# Patient Record
Sex: Male | Born: 1955 | Race: White | Hispanic: No | Marital: Married
Health system: Midwestern US, Academic
[De-identification: ages and names within clinical notes are randomized; demographics above are authoritative.]

---

## 2017-11-26 ENCOUNTER — Encounter: Admit: 2017-11-26 | Discharge: 2017-11-26

## 2017-12-08 ENCOUNTER — Encounter: Admit: 2017-12-08 | Discharge: 2017-12-08

## 2017-12-08 ENCOUNTER — Ambulatory Visit: Admit: 2017-12-08 | Discharge: 2017-12-08 | Payer: BC Managed Care – PPO

## 2017-12-08 DIAGNOSIS — Z9889 Other specified postprocedural states: ICD-10-CM

## 2017-12-08 DIAGNOSIS — R1031 Right lower quadrant pain: Principal | ICD-10-CM

## 2017-12-10 ENCOUNTER — Encounter: Admit: 2017-12-10 | Discharge: 2017-12-10

## 2017-12-10 DIAGNOSIS — Z298 Encounter for other specified prophylactic measures: Principal | ICD-10-CM

## 2017-12-17 ENCOUNTER — Encounter: Admit: 2017-12-17 | Discharge: 2017-12-18

## 2017-12-17 ENCOUNTER — Encounter: Admit: 2017-12-17 | Discharge: 2017-12-17

## 2017-12-17 DIAGNOSIS — R69 Illness, unspecified: Principal | ICD-10-CM

## 2018-01-05 ENCOUNTER — Encounter: Admit: 2018-01-05 | Discharge: 2018-01-05

## 2018-01-05 ENCOUNTER — Ambulatory Visit: Admit: 2018-01-05 | Discharge: 2018-01-05 | Payer: BC Managed Care – PPO

## 2018-01-05 DIAGNOSIS — Z9889 Other specified postprocedural states: ICD-10-CM

## 2018-01-05 DIAGNOSIS — R1031 Right lower quadrant pain: Principal | ICD-10-CM

## 2018-01-07 ENCOUNTER — Encounter: Admit: 2018-01-07 | Discharge: 2018-01-07

## 2018-01-07 DIAGNOSIS — R69 Illness, unspecified: Principal | ICD-10-CM

## 2018-01-14 ENCOUNTER — Encounter: Admit: 2018-01-14 | Discharge: 2018-01-14

## 2018-01-14 ENCOUNTER — Ambulatory Visit: Admit: 2018-01-14 | Discharge: 2018-01-15 | Payer: BC Managed Care – PPO

## 2018-01-14 DIAGNOSIS — H269 Unspecified cataract: Principal | ICD-10-CM

## 2018-01-14 DIAGNOSIS — R51 Headache: ICD-10-CM

## 2018-01-14 DIAGNOSIS — M5442 Lumbago with sciatica, left side: Principal | ICD-10-CM

## 2018-02-04 ENCOUNTER — Encounter: Admit: 2018-02-04 | Discharge: 2018-02-04

## 2018-02-04 DIAGNOSIS — M48061 Spinal stenosis, lumbar region without neurogenic claudication: Secondary | ICD-10-CM

## 2018-02-04 DIAGNOSIS — M5116 Intervertebral disc disorders with radiculopathy, lumbar region: Principal | ICD-10-CM

## 2018-02-04 MED ORDER — CEFAZOLIN INJ 1GM IVP
2 g | Freq: Once | INTRAVENOUS | 0 refills | Status: CN
Start: 2018-02-04 — End: ?

## 2018-02-18 ENCOUNTER — Encounter: Admit: 2018-02-18 | Discharge: 2018-02-18

## 2018-02-18 ENCOUNTER — Ambulatory Visit: Admit: 2018-02-18 | Discharge: 2018-02-19 | Payer: BC Managed Care – PPO

## 2018-02-18 DIAGNOSIS — R51 Headache: ICD-10-CM

## 2018-02-18 DIAGNOSIS — M1990 Unspecified osteoarthritis, unspecified site: ICD-10-CM

## 2018-02-18 DIAGNOSIS — K929 Disease of digestive system, unspecified: ICD-10-CM

## 2018-02-18 DIAGNOSIS — H269 Unspecified cataract: Principal | ICD-10-CM

## 2018-02-18 DIAGNOSIS — E079 Disorder of thyroid, unspecified: ICD-10-CM

## 2018-02-18 LAB — BASIC METABOLIC PANEL
Lab: 0.9 mg/dL (ref 0.4–1.24)
Lab: 105 MMOL/L — ABNORMAL LOW (ref 98–110)
Lab: 11 mg/dL (ref 7–25)
Lab: 112 mg/dL — ABNORMAL HIGH (ref 70–100)
Lab: 139 MMOL/L — ABNORMAL LOW (ref 137–147)
Lab: 29 MMOL/L (ref 21–30)
Lab: 3.9 MMOL/L — ABNORMAL LOW (ref 3.5–5.1)
Lab: 5 pg (ref 3–12)
Lab: 8.9 mg/dL (ref 8.5–10.6)
Lab: >60 mL/min (ref >60)
Lab: >60 mL/min (ref >60)

## 2018-02-18 LAB — CBC: Lab: 5.2 10*3/uL (ref 4.5–11.0)

## 2018-02-19 DIAGNOSIS — G8929 Other chronic pain: ICD-10-CM

## 2018-02-19 DIAGNOSIS — M5442 Lumbago with sciatica, left side: Principal | ICD-10-CM

## 2018-02-19 DIAGNOSIS — Z01818 Encounter for other preprocedural examination: ICD-10-CM

## 2018-03-08 ENCOUNTER — Encounter: Admit: 2018-03-08 | Discharge: 2018-03-08

## 2018-03-08 ENCOUNTER — Ambulatory Visit: Admit: 2018-03-08 | Discharge: 2018-03-08 | Payer: BC Managed Care – PPO

## 2018-03-08 DIAGNOSIS — M4726 Other spondylosis with radiculopathy, lumbar region: ICD-10-CM

## 2018-03-08 DIAGNOSIS — E079 Disorder of thyroid, unspecified: ICD-10-CM

## 2018-03-08 DIAGNOSIS — M5137 Other intervertebral disc degeneration, lumbosacral region: ICD-10-CM

## 2018-03-08 DIAGNOSIS — M5442 Lumbago with sciatica, left side: ICD-10-CM

## 2018-03-08 DIAGNOSIS — M48061 Spinal stenosis, lumbar region without neurogenic claudication: Principal | ICD-10-CM

## 2018-03-08 DIAGNOSIS — G8929 Other chronic pain: ICD-10-CM

## 2018-03-08 DIAGNOSIS — K929 Disease of digestive system, unspecified: ICD-10-CM

## 2018-03-08 DIAGNOSIS — M1990 Unspecified osteoarthritis, unspecified site: ICD-10-CM

## 2018-03-08 DIAGNOSIS — H269 Unspecified cataract: Principal | ICD-10-CM

## 2018-03-08 DIAGNOSIS — R51 Headache: ICD-10-CM

## 2018-03-08 MED ORDER — SODIUM CHLORIDE 0.9 % IV SOLP
1000 mL | INTRAVENOUS | 0 refills | Status: DC
Start: 2018-03-08 — End: 2018-03-09
  Administered 2018-03-08: 20:00:00 1000.000 mL via INTRAVENOUS

## 2018-03-08 MED ORDER — DEXAMETHASONE SODIUM PHOSPHATE 4 MG/ML IJ SOLN
4 mg | Freq: Once | INTRAVENOUS | 0 refills | Status: DC | PRN
Start: 2018-03-08 — End: 2018-03-09

## 2018-03-08 MED ORDER — FENTANYL CITRATE (PF) 50 MCG/ML IJ SOLN
12.5 ug | INTRAVENOUS | 0 refills | Status: DC | PRN
Start: 2018-03-08 — End: 2018-03-09

## 2018-03-08 MED ORDER — CEFAZOLIN INJ 1GM IVP
2 g | Freq: Once | INTRAVENOUS | 0 refills | Status: CP
Start: 2018-03-08 — End: ?
  Administered 2018-03-08: 19:00:00 2 g via INTRAVENOUS

## 2018-03-08 MED ORDER — PROPOFOL 10 MG/ML IV EMUL (INFUSION)(AM)(OR)
INTRAVENOUS | 0 refills | Status: DC
Start: 2018-03-08 — End: 2018-03-08
  Administered 2018-03-08: 18:00:00 120 ug/kg/min via INTRAVENOUS

## 2018-03-08 MED ORDER — MIDAZOLAM 1 MG/ML IJ SOLN
INTRAVENOUS | 0 refills | Status: DC
Start: 2018-03-08 — End: 2018-03-08
  Administered 2018-03-08: 18:00:00 2 mg via INTRAVENOUS

## 2018-03-08 MED ORDER — OXYCODONE 5 MG PO TAB
5 mg | Freq: Once | ORAL | 0 refills | Status: CP
Start: 2018-03-08 — End: ?
  Administered 2018-03-08: 21:00:00 5 mg via ORAL

## 2018-03-08 MED ORDER — HALOPERIDOL LACTATE 5 MG/ML IJ SOLN
1 mg | Freq: Once | INTRAVENOUS | 0 refills | Status: DC | PRN
Start: 2018-03-08 — End: 2018-03-09

## 2018-03-08 MED ORDER — BACITRACIN 50,000 UN NS 500 ML IRR BOT (OR)
0 refills | Status: DC
Start: 2018-03-08 — End: 2018-03-08
  Administered 2018-03-08 (×2): 500 mL

## 2018-03-08 MED ORDER — BUPIVACAINE 0.25 % (2.5 MG/ML) IJ SOLN
0 refills | Status: DC
Start: 2018-03-08 — End: 2018-03-08
  Administered 2018-03-08: 19:00:00 4 mL via INTRAMUSCULAR

## 2018-03-08 MED ORDER — REMIFENTANYL 1000MCG IN NS 20ML (OR)
INTRAVENOUS | 0 refills | Status: DC
Start: 2018-03-08 — End: 2018-03-08
  Administered 2018-03-08 (×2): .08 ug/kg/min via INTRAVENOUS

## 2018-03-08 MED ORDER — ACETAMINOPHEN 1,000 MG/100 ML (10 MG/ML) IV SOLN
0 refills | Status: DC
Start: 2018-03-08 — End: 2018-03-08
  Administered 2018-03-08: 20:00:00 1000 mg via INTRAVENOUS

## 2018-03-08 MED ORDER — ROCURONIUM 10 MG/ML IV SOLN
INTRAVENOUS | 0 refills | Status: DC
Start: 2018-03-08 — End: 2018-03-08
  Administered 2018-03-08: 18:00:00 60 mg via INTRAVENOUS

## 2018-03-08 MED ORDER — LIDOCAINE (PF) 10 MG/ML (1 %) IJ SOLN
.1-2 mL | INTRAMUSCULAR | 0 refills | Status: DC | PRN
Start: 2018-03-08 — End: 2018-03-09
  Administered 2018-03-08: 16:00:00 0.1 mL via INTRAMUSCULAR

## 2018-03-08 MED ORDER — THROMBIN (BOVINE) 5,000 UNIT TP SOLR
0 refills | Status: DC
Start: 2018-03-08 — End: 2018-03-08
  Administered 2018-03-08: 19:00:00 5000 [IU] via TOPICAL

## 2018-03-08 MED ORDER — FENTANYL CITRATE (PF) 50 MCG/ML IJ SOLN
25-50 ug | INTRAVENOUS | 0 refills | Status: DC | PRN
Start: 2018-03-08 — End: 2018-03-09

## 2018-03-08 MED ORDER — HYDROCODONE-ACETAMINOPHEN 5-325 MG PO TAB
1 | ORAL_TABLET | ORAL | 0 refills | 30.00000 days | Status: AC | PRN
Start: 2018-03-08 — End: 2018-07-11
  Filled 2018-03-08 (×2): qty 60, 7d supply, fill #1

## 2018-03-08 MED ORDER — LIDOCAINE (PF) 200 MG/10 ML (2 %) IJ SYRG
0 refills | Status: DC
Start: 2018-03-08 — End: 2018-03-08
  Administered 2018-03-08: 18:00:00 100 mg via INTRAVENOUS

## 2018-03-08 MED ORDER — PROPOFOL INJ 10 MG/ML IV VIAL
INTRAVENOUS | 0 refills | Status: DC
Start: 2018-03-08 — End: 2018-03-08
  Administered 2018-03-08: 20:00:00 50 mg via INTRAVENOUS
  Administered 2018-03-08: 18:00:00 150 mg via INTRAVENOUS

## 2018-03-08 MED ORDER — SCOPOLAMINE BASE 1 MG OVER 3 DAYS TD PT3D
0 refills | Status: DC
Start: 2018-03-08 — End: 2018-03-08
  Administered 2018-03-08: 18:00:00 1 via TOPICAL

## 2018-03-08 MED ORDER — DEXAMETHASONE SODIUM PHOSPHATE 4 MG/ML IJ SOLN
INTRAVENOUS | 0 refills | Status: DC
Start: 2018-03-08 — End: 2018-03-08
  Administered 2018-03-08: 19:00:00 4 mg via INTRAVENOUS

## 2018-03-08 MED ORDER — SUGAMMADEX 100 MG/ML IV SOLN
INTRAVENOUS | 0 refills | Status: DC
Start: 2018-03-08 — End: 2018-03-08
  Administered 2018-03-08: 20:00:00 190 mg via INTRAVENOUS

## 2018-03-08 MED ORDER — FENTANYL CITRATE (PF) 50 MCG/ML IJ SOLN
50 ug | INTRAVENOUS | 0 refills | Status: DC | PRN
Start: 2018-03-08 — End: 2018-03-09

## 2018-03-08 MED ORDER — LIDOCAINE-EPINEPHRINE 1 %-1:100,000 IJ SOLN
0 refills | Status: DC
Start: 2018-03-08 — End: 2018-03-08
  Administered 2018-03-08: 19:00:00 4 mL via INTRAMUSCULAR

## 2018-03-08 MED ORDER — ONDANSETRON HCL (PF) 4 MG/2 ML IJ SOLN
4 mg | Freq: Once | INTRAVENOUS | 0 refills | Status: DC | PRN
Start: 2018-03-08 — End: 2018-03-09

## 2018-03-08 MED ORDER — FENTANYL CITRATE (PF) 50 MCG/ML IJ SOLN
0 refills | Status: DC
Start: 2018-03-08 — End: 2018-03-08
  Administered 2018-03-08: 18:00:00 100 ug via INTRAVENOUS

## 2018-03-08 MED ORDER — METHYLPREDNISOLONE ACETATE 40 MG/ML IJ SUSP
0 refills | Status: DC
Start: 2018-03-08 — End: 2018-03-08
  Administered 2018-03-08: 19:00:00 40 mg via INTRAMUSCULAR

## 2018-03-08 MED ADMIN — SODIUM CHLORIDE 0.9 % IV SOLP [27838]: 1000 mL | INTRAVENOUS | @ 16:00:00 | Stop: 2018-03-08 | NDC 00338004904

## 2018-03-10 ENCOUNTER — Encounter: Admit: 2018-03-10 | Discharge: 2018-03-10

## 2018-03-10 DIAGNOSIS — E079 Disorder of thyroid, unspecified: ICD-10-CM

## 2018-03-10 DIAGNOSIS — R51 Headache: ICD-10-CM

## 2018-03-10 DIAGNOSIS — M1990 Unspecified osteoarthritis, unspecified site: ICD-10-CM

## 2018-03-10 DIAGNOSIS — H269 Unspecified cataract: Principal | ICD-10-CM

## 2018-03-10 DIAGNOSIS — K929 Disease of digestive system, unspecified: ICD-10-CM

## 2018-03-22 ENCOUNTER — Encounter: Admit: 2018-03-22 | Discharge: 2018-03-23

## 2018-03-24 ENCOUNTER — Ambulatory Visit: Admit: 2018-03-24 | Discharge: 2018-03-25 | Payer: BC Managed Care – PPO

## 2018-03-24 DIAGNOSIS — Z5189 Encounter for other specified aftercare: Principal | ICD-10-CM

## 2018-04-21 ENCOUNTER — Encounter: Admit: 2018-04-21 | Discharge: 2018-04-22

## 2018-05-06 ENCOUNTER — Encounter: Admit: 2018-05-06 | Discharge: 2018-05-06

## 2018-05-06 ENCOUNTER — Ambulatory Visit: Admit: 2018-05-06 | Discharge: 2018-05-07 | Payer: BC Managed Care – PPO

## 2018-05-06 DIAGNOSIS — E079 Disorder of thyroid, unspecified: ICD-10-CM

## 2018-05-06 DIAGNOSIS — M1990 Unspecified osteoarthritis, unspecified site: ICD-10-CM

## 2018-05-06 DIAGNOSIS — K929 Disease of digestive system, unspecified: ICD-10-CM

## 2018-05-06 DIAGNOSIS — H269 Unspecified cataract: Principal | ICD-10-CM

## 2018-05-06 DIAGNOSIS — R51 Headache: ICD-10-CM

## 2018-05-07 DIAGNOSIS — M542 Cervicalgia: Principal | ICD-10-CM

## 2018-06-17 ENCOUNTER — Ambulatory Visit: Admit: 2018-06-17 | Discharge: 2018-06-18 | Payer: BC Managed Care – PPO

## 2018-06-17 ENCOUNTER — Encounter: Admit: 2018-06-17 | Discharge: 2018-06-17

## 2018-06-17 DIAGNOSIS — K929 Disease of digestive system, unspecified: ICD-10-CM

## 2018-06-17 DIAGNOSIS — M1990 Unspecified osteoarthritis, unspecified site: ICD-10-CM

## 2018-06-17 DIAGNOSIS — E079 Disorder of thyroid, unspecified: ICD-10-CM

## 2018-06-17 DIAGNOSIS — R51 Headache: ICD-10-CM

## 2018-06-17 DIAGNOSIS — H269 Unspecified cataract: Principal | ICD-10-CM

## 2018-06-17 MED ORDER — DULOXETINE 30 MG PO CPDR
60 mg | ORAL_CAPSULE | Freq: Every day | ORAL | 0 refills | 60.00000 days | Status: AC
Start: 2018-06-17 — End: 2018-08-11

## 2018-06-17 MED ORDER — TIZANIDINE 2 MG PO TAB
4 mg | ORAL_TABLET | Freq: Every evening | ORAL | 2 refills | Status: AC | PRN
Start: 2018-06-17 — End: 2018-08-11

## 2018-06-18 DIAGNOSIS — M542 Cervicalgia: Principal | ICD-10-CM

## 2018-06-18 DIAGNOSIS — M7912 Myalgia of auxiliary muscles, head and neck: ICD-10-CM

## 2018-06-18 DIAGNOSIS — M5412 Radiculopathy, cervical region: ICD-10-CM

## 2018-07-12 ENCOUNTER — Ambulatory Visit: Admit: 2018-07-12 | Discharge: 2018-07-12 | Payer: BC Managed Care – PPO

## 2018-07-12 ENCOUNTER — Encounter: Admit: 2018-07-12 | Discharge: 2018-07-12

## 2018-07-12 ENCOUNTER — Ambulatory Visit: Admit: 2018-07-12 | Discharge: 2018-07-12

## 2018-07-12 DIAGNOSIS — H269 Unspecified cataract: Principal | ICD-10-CM

## 2018-07-12 DIAGNOSIS — E079 Disorder of thyroid, unspecified: ICD-10-CM

## 2018-07-12 DIAGNOSIS — M5412 Radiculopathy, cervical region: Principal | ICD-10-CM

## 2018-07-12 DIAGNOSIS — M1990 Unspecified osteoarthritis, unspecified site: ICD-10-CM

## 2018-07-12 DIAGNOSIS — R51 Headache: ICD-10-CM

## 2018-07-12 DIAGNOSIS — M7918 Myalgia, other site: ICD-10-CM

## 2018-07-12 DIAGNOSIS — K929 Disease of digestive system, unspecified: ICD-10-CM

## 2018-07-12 MED ORDER — LIDOCAINE (PF) 10 MG/ML (1 %) IJ SOLN
5 mL | Freq: Once | INTRAMUSCULAR | 0 refills | Status: CP
Start: 2018-07-12 — End: ?

## 2018-07-12 MED ORDER — OTHER MEDICATION
1 | Freq: Once | EPIDURAL | 0 refills | Status: CP
Start: 2018-07-12 — End: ?

## 2018-07-12 MED ORDER — TRIAMCINOLONE ACETONIDE 40 MG/ML IJ SUSP
80 mg | Freq: Once | EPIDURAL | 0 refills | Status: CP
Start: 2018-07-12 — End: ?

## 2018-07-12 MED ORDER — LIDOCAINE (PF) 10 MG/ML (1 %) IJ SOLN
5 mL | Freq: Once | INTRAMUSCULAR | 0 refills | Status: CP | PRN
Start: 2018-07-12 — End: ?

## 2018-08-11 ENCOUNTER — Encounter: Admit: 2018-08-11 | Discharge: 2018-08-11

## 2018-08-11 MED ORDER — TIZANIDINE 2 MG PO TAB
4 mg | ORAL_TABLET | Freq: Every evening | ORAL | 3 refills | Status: AC | PRN
Start: 2018-08-11 — End: 2018-10-14

## 2018-08-11 MED ORDER — DULOXETINE 60 MG PO CPDR
60 mg | ORAL_CAPSULE | Freq: Every day | ORAL | 3 refills | 60.00000 days | Status: AC
Start: 2018-08-11 — End: 2018-10-14

## 2018-10-14 ENCOUNTER — Encounter: Admit: 2018-10-14 | Discharge: 2018-10-14

## 2018-10-14 ENCOUNTER — Ambulatory Visit: Admit: 2018-10-14 | Discharge: 2018-10-14

## 2018-10-14 ENCOUNTER — Ambulatory Visit: Admit: 2018-10-14 | Discharge: 2018-10-14 | Payer: BC Managed Care – PPO

## 2018-10-14 DIAGNOSIS — M5481 Occipital neuralgia: ICD-10-CM

## 2018-10-14 DIAGNOSIS — E079 Disorder of thyroid, unspecified: ICD-10-CM

## 2018-10-14 DIAGNOSIS — H269 Unspecified cataract: Principal | ICD-10-CM

## 2018-10-14 DIAGNOSIS — M1990 Unspecified osteoarthritis, unspecified site: ICD-10-CM

## 2018-10-14 DIAGNOSIS — R51 Headache: ICD-10-CM

## 2018-10-14 DIAGNOSIS — K929 Disease of digestive system, unspecified: ICD-10-CM

## 2018-10-14 DIAGNOSIS — M5030 Other cervical disc degeneration, unspecified cervical region: ICD-10-CM

## 2018-10-14 DIAGNOSIS — M7918 Myalgia, other site: Secondary | ICD-10-CM

## 2018-10-14 DIAGNOSIS — M5442 Lumbago with sciatica, left side: Principal | ICD-10-CM

## 2018-10-14 DIAGNOSIS — M5412 Radiculopathy, cervical region: Principal | ICD-10-CM

## 2018-10-14 MED ORDER — DULOXETINE 60 MG PO CPDR
60 mg | ORAL_CAPSULE | Freq: Every day | ORAL | 3 refills | 60.00000 days | Status: AC
Start: 2018-10-14 — End: 2019-06-09

## 2019-02-14 ENCOUNTER — Encounter: Admit: 2019-02-14 | Discharge: 2019-02-14

## 2019-02-17 ENCOUNTER — Ambulatory Visit: Admit: 2019-02-17 | Discharge: 2019-02-18 | Payer: BC Managed Care – PPO

## 2019-02-17 ENCOUNTER — Encounter: Admit: 2019-02-17 | Discharge: 2019-02-17

## 2019-02-17 DIAGNOSIS — H269 Unspecified cataract: Principal | ICD-10-CM

## 2019-02-17 DIAGNOSIS — E079 Disorder of thyroid, unspecified: ICD-10-CM

## 2019-02-17 DIAGNOSIS — R51 Headache: ICD-10-CM

## 2019-02-17 DIAGNOSIS — M1990 Unspecified osteoarthritis, unspecified site: ICD-10-CM

## 2019-02-17 DIAGNOSIS — K929 Disease of digestive system, unspecified: ICD-10-CM

## 2019-02-18 DIAGNOSIS — G8929 Other chronic pain: ICD-10-CM

## 2019-02-18 DIAGNOSIS — M5442 Lumbago with sciatica, left side: Principal | ICD-10-CM

## 2019-02-20 ENCOUNTER — Encounter: Admit: 2019-02-20 | Discharge: 2019-02-20

## 2019-02-24 ENCOUNTER — Encounter: Admit: 2019-02-24 | Discharge: 2019-02-24

## 2019-02-24 ENCOUNTER — Ambulatory Visit: Admit: 2019-02-24 | Discharge: 2019-02-25 | Payer: BC Managed Care – PPO

## 2019-02-24 DIAGNOSIS — K929 Disease of digestive system, unspecified: ICD-10-CM

## 2019-02-24 DIAGNOSIS — H269 Unspecified cataract: Principal | ICD-10-CM

## 2019-02-24 DIAGNOSIS — R51 Headache: ICD-10-CM

## 2019-02-24 DIAGNOSIS — E079 Disorder of thyroid, unspecified: ICD-10-CM

## 2019-02-24 DIAGNOSIS — M1990 Unspecified osteoarthritis, unspecified site: ICD-10-CM

## 2019-02-24 DIAGNOSIS — M961 Postlaminectomy syndrome, not elsewhere classified: ICD-10-CM

## 2019-02-24 DIAGNOSIS — M5030 Other cervical disc degeneration, unspecified cervical region: ICD-10-CM

## 2019-02-24 DIAGNOSIS — M5481 Occipital neuralgia: ICD-10-CM

## 2019-02-24 DIAGNOSIS — M7918 Myalgia, other site: ICD-10-CM

## 2019-02-25 DIAGNOSIS — M7912 Myalgia of auxiliary muscles, head and neck: ICD-10-CM

## 2019-02-25 DIAGNOSIS — M5412 Radiculopathy, cervical region: ICD-10-CM

## 2019-02-25 DIAGNOSIS — M5417 Radiculopathy, lumbosacral region: Principal | ICD-10-CM

## 2019-03-02 ENCOUNTER — Encounter: Admit: 2019-03-02 | Discharge: 2019-03-02

## 2019-03-09 ENCOUNTER — Encounter: Admit: 2019-03-09 | Discharge: 2019-03-09

## 2019-03-16 ENCOUNTER — Encounter: Admit: 2019-03-16 | Discharge: 2019-03-16

## 2019-03-16 ENCOUNTER — Ambulatory Visit: Admit: 2019-03-16 | Discharge: 2019-03-16 | Payer: BC Managed Care – PPO

## 2019-03-16 ENCOUNTER — Ambulatory Visit: Admit: 2019-03-16 | Discharge: 2019-03-16

## 2019-03-16 ENCOUNTER — Ambulatory Visit: Admit: 2019-03-16 | Discharge: 2019-03-17

## 2019-03-16 DIAGNOSIS — R52 Pain, unspecified: ICD-10-CM

## 2019-03-16 DIAGNOSIS — M7918 Myalgia, other site: ICD-10-CM

## 2019-03-16 DIAGNOSIS — M5417 Radiculopathy, lumbosacral region: ICD-10-CM

## 2019-03-16 DIAGNOSIS — K929 Disease of digestive system, unspecified: ICD-10-CM

## 2019-03-16 DIAGNOSIS — M5137 Other intervertebral disc degeneration, lumbosacral region: ICD-10-CM

## 2019-03-16 DIAGNOSIS — G8929 Other chronic pain: ICD-10-CM

## 2019-03-16 DIAGNOSIS — H269 Unspecified cataract: Principal | ICD-10-CM

## 2019-03-16 DIAGNOSIS — R51 Headache: ICD-10-CM

## 2019-03-16 DIAGNOSIS — M5442 Lumbago with sciatica, left side: Principal | ICD-10-CM

## 2019-03-16 DIAGNOSIS — M1990 Unspecified osteoarthritis, unspecified site: ICD-10-CM

## 2019-03-16 DIAGNOSIS — E079 Disorder of thyroid, unspecified: ICD-10-CM

## 2019-03-16 MED ORDER — DEXAMETHASONE SODIUM PHOS (PF) 10 MG/ML IJ SOLN
15 mg | Freq: Once | 0 refills | Status: CP
Start: 2019-03-16 — End: ?

## 2019-03-16 MED ORDER — LIDOCAINE (PF) 10 MG/ML (1 %) IJ SOLN
5 mL | Freq: Once | INTRAMUSCULAR | 0 refills | Status: CP | PRN
Start: 2019-03-16 — End: ?

## 2019-03-16 MED ORDER — LIDOCAINE (PF) 10 MG/ML (1 %) IJ SOLN
2 mL | Freq: Once | INTRAMUSCULAR | 0 refills | Status: CP
Start: 2019-03-16 — End: ?

## 2019-03-16 MED ORDER — IOHEXOL 300 MG IODINE/ML IV SOLN
2 mL | Freq: Once | 0 refills | Status: CP
Start: 2019-03-16 — End: ?

## 2019-04-13 ENCOUNTER — Ambulatory Visit: Admit: 2019-04-13 | Discharge: 2019-04-14

## 2019-04-13 ENCOUNTER — Encounter: Admit: 2019-04-13 | Discharge: 2019-04-13

## 2019-04-13 DIAGNOSIS — M5417 Radiculopathy, lumbosacral region: Secondary | ICD-10-CM

## 2019-04-13 DIAGNOSIS — H269 Unspecified cataract: Secondary | ICD-10-CM

## 2019-04-13 DIAGNOSIS — R51 Headache: Secondary | ICD-10-CM

## 2019-04-13 DIAGNOSIS — M5030 Other cervical disc degeneration, unspecified cervical region: Secondary | ICD-10-CM

## 2019-04-13 DIAGNOSIS — M1990 Unspecified osteoarthritis, unspecified site: Secondary | ICD-10-CM

## 2019-04-13 DIAGNOSIS — E079 Disorder of thyroid, unspecified: Secondary | ICD-10-CM

## 2019-04-13 DIAGNOSIS — M7918 Myalgia, other site: Secondary | ICD-10-CM

## 2019-04-13 DIAGNOSIS — M5412 Radiculopathy, cervical region: Secondary | ICD-10-CM

## 2019-04-13 DIAGNOSIS — K929 Disease of digestive system, unspecified: Secondary | ICD-10-CM

## 2019-04-13 NOTE — Progress Notes
Obtained patient's verbal consent to treat them and their agreement to [facility] financial policy and NPP via this telehealth visit during the [facility]    [facility] - Interventional Pain    Subjective     Chief Complaint: multifocal pain    Interval HPI: Frank Jenkins is a 63 y.o. male who  has a past medical history of Arthritis, Cataract, Disorder of thyroid gland, Gastrointestinal disorder, and Generalized headaches. who now presents for evaluation.    Patient reports 90% relief after left L4-S1 TFESI and TPI performed on 03/16/2019. Patient states he is extremely happy with current level of relief.     The pain is in the across the low back, will radiate in anterolateral distribution to the knee in LLE  Denies radicular pain in RLE  Pain is intermittent  Numbness/tingling: yes - intermittent, noted in LLE  The pain ranges 2-5/10  The pain is described as ache, sore  The pain is exacerbated by walking > 1 mile,  The pain is partially alleviated by position changes/sitting, water   +muscle stiffness/tightness  Reports intermittent weakness in BLE    NECK  Pain is located in the middle of the low neck, will radiate to bilateral posterior shoulders, R>L  Pain is intermittent   Rated as 4-5/10  Described as ache, stiff  +HA - significantly improved since starting Cymbalta  Reports some decrease in grip strength    Currently taking Cymbalta 60mg  daily and reports significant relief of neck, HA, and low back pain. Denies any SEs from medication use.    Hx of spine surgery: L4-L5 Discectomy - Dr. Lorel Monaco    Patient denies fevers, chills, infection, bleeding issues, anticoagulants, new muscle weakness, numbness, tingling, bowel/bladder incontinence, or saddle anesthesia.           PRIOR MEDICATIONS:   Effective  Cymbalta  Tizanidine (little)    Ineffective  Tylenol  Flexeril      Unable to tolerate  NSAID    Never  Gabapentin  Lyrica  Ami/Nortriptyline PRIOR INTERVENTIONS:  L-spine surgery 03/2018 at   Effective  left L4-S1 TFESI   TPI  LESI (prior to surgery)    Ineffective  Exercise, limited by pain        Frank Jenkins denies any recent fevers, chills, infection, antibiotics, bowel or bladder incontinence, saddle anesthesia, bleeding issues, or recent anticoagulant.     ROS: All 14 systems reviewed and found to be negative except as above and as follows. +HA    Past Medical History:  Medical History:   Diagnosis Date   ??? Arthritis    ??? Cataract    ??? Disorder of thyroid gland     hypothyroidectomy   ??? Gastrointestinal disorder     GERD   ??? Generalized headaches        Family History:  Family History   Problem Relation Age of Onset   ??? Cataract Mother    ??? Hypertension Mother    ??? Hyperlipidemia Mother    ??? Thyroid Disease Mother    ??? Cancer Father         lung   ??? Cancer Sister         breast   ??? Cancer Brother         multiple myeloma   ??? Cancer Sister         breast   ??? Cancer Sister         breast   ???  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   Social History     Socioeconomic History   ??? Marital status: Married     Spouse name: Not on file   ??? Number of children: Not on file   ??? Years of education: Not on file   ??? Highest education level: Not on file   Occupational History   ??? Not on file   Tobacco Use   ??? Smoking status: Former Smoker     Years: 4.00     Types: Cigarettes     Last attempt to quit: 01/15/1979     Years since quitting: 40.2   ??? Smokeless tobacco: Never Used   Substance and Sexual Activity   ??? Alcohol use: Yes     Comment: beer socially   ??? Drug use: No   ??? Sexual activity: Not on file   Other Topics Concern   ??? Not on file   Social History Narrative   ??? Not on file       Allergies:  Allergies   Allergen Reactions   ??? Albuterol RASH and EDEMA   ??? Aspirin ANAPHYLAXIS and EDEMA   ??? Ibuprofen ANAPHYLAXIS   ??? Ketorolac ANAPHYLAXIS ??? Nsaids (Non-Steroidal Anti-Inflammatory Drug) ANAPHYLAXIS       Medications:    Current Outpatient Medications:   ???  acetaminophen (TYLENOL) 500 mg tablet, Take 500 mg by mouth every 6 hours as needed for Pain. Max of 4,000 mg of acetaminophen in 24 hours., Disp: , Rfl:   ???  calcium carbonate (TUMS) 500 mg (200 mg elemental calcium) chewable tablet, Chew 500-1,000 mg by mouth daily as needed., Disp: , Rfl:   ???  duloxetine DR (CYMBALTA) 60 mg capsule, Take one capsule by mouth daily. Start after 30 mg gone., Disp: 30 capsule, Rfl: 3  ???  hypromellose/dextran (ARTIFICIAL TEARS(DEXT70-HYPRO)) drop, Instill 1 drop into both eyes daily as needed, Disp: , Rfl:   ???  levothyroxine (SYNTHROID) 50 mcg tablet, Take 50 mcg by mouth daily 30 minutes before breakfast., Disp: , Rfl:   ???  methyl salicylate/menthol (BENGAY TP), Apply  topically to affected area daily as needed., Disp: , Rfl:   ???  pantoprazole DR (PROTONIX) 40 mg tablet, Take 40 mg by mouth every morning., Disp: , Rfl:     Physical examination:   Ht 177.8 cm (70)  - Wt 95.3 kg (210 lb)  - BMI 30.13 kg/m???   Pain Score: One    Patient has h/o HTN: No  Patient reports being afebrile Yes    Exam performed by instructing patient to perform various maneuvers and provide feedback while I personally visualized the patient exam.     General: The patient is a well-developed, well nourished 63 y.o. male in no acute distress.   HEENT: Head is normocephalic and atraumatic.   Pulmonary: The patient has unlabored respirations and bilateral symmetric chest excursion.   Abdomen: Soft, nontender, and nondistended. There is no rebound or guarding reported with patient palpation.   Extremities: No peripheral edema reported by patient or seen on video.     Neurologic:   The patient is alert and oriented times 3.   Cranial nerves II through XII are intact without any focal deficits.     Musculoskeletal:   Gait is normal.    C-Spine There is moderate paraspinal tenderness  per patient assessment.  Moderate TTP to bilateral trapezius per patient assessment.   ROM with flexion, extension, rotation, and lateral bending is intact but stiff.  Patient believes strength is equal and adequate bilaterally in the flexors and extensors of the bilateral upper extremities.     L-Spine   Facet loading is positive.  ROM with flexion, extension, rotation, and lateral bending is intact.  Patient believes strength is equal and adequate bilaterally in the flexors and extensors of the bilateral lower extremities.         MRI C-Spine  04/2018  [facility]    C2-3 Min DB.   C3-4 Mild broad DB. L NFS. +FA.   C4-5 Broad DB. +FA. Bil NFS mod-sev R and mod L.   C5-6 Mild L FA. Mil L NFS        Last Cr and LFT's:  Creatinine   Date Value Ref Range Status   02/18/2018 0.91 0.4 - 1.24 MG/DL Final          Assessment:    Frank Jenkins is a 63 y.o. male who  has a past medical history of Arthritis, Cataract, Disorder of thyroid gland, Gastrointestinal disorder, and Generalized headaches. who presents for evaluation of pain.    The pain complaints are most likely due to:    1. Cervical radiculopathy     2. DDD (degenerative disc disease), cervical     3. Myalgia, other site  [place] AMB SPINE PROC TRIGGER POINT INJECTION   4. Lumbosacral radiculopathy         Patient has had an adequate trial of > 3 months of rest, exercise, multimodal treatment, and the passage of time without improvement of symptoms. The pain has significant impact on the daily quality of life.     Plan:    1. Discussed plan of care options with patient. Will schedule CESI at next available appointment.   2. Will plan for TPI at same procedure.  3. Continue Cymbalta as prescribed. Discussed risks and benefits of medication therapy and possible SEs, patient verbalized understanding and all questions answered.   4. May still consider bilateral L4-S1 MBBs in future for lumbar facetogenic pain. 5. May consider trial of Gabapentin in future, patient wishes to defer at this time and doesn't wish to pursue any additional medications.  6. Follow up as needed.     Risks/benefits of all pharmacologic and interventional treatments discussed and questions answered.      Todays visit took place via face-to-face encounter utilizing Zoom application. Visit Start Time 1441 Visit End Time 1515.  Today's visit was prolonged due to additional time spent pharmacological therapies and interventional scheduling options.

## 2019-06-08 ENCOUNTER — Encounter: Admit: 2019-06-08 | Discharge: 2019-06-08

## 2019-06-09 MED ORDER — DULOXETINE 60 MG PO CPDR
60 mg | ORAL_CAPSULE | Freq: Every day | ORAL | 5 refills | 60.00000 days | Status: DC
Start: 2019-06-09 — End: 2019-06-15

## 2019-06-15 ENCOUNTER — Encounter: Admit: 2019-06-15 | Discharge: 2019-06-15

## 2019-06-15 ENCOUNTER — Ambulatory Visit: Admit: 2019-06-15 | Discharge: 2019-06-15

## 2019-06-15 DIAGNOSIS — M1990 Unspecified osteoarthritis, unspecified site: Secondary | ICD-10-CM

## 2019-06-15 DIAGNOSIS — R52 Pain, unspecified: Principal | ICD-10-CM

## 2019-06-15 DIAGNOSIS — R51 Headache: Secondary | ICD-10-CM

## 2019-06-15 DIAGNOSIS — M5412 Radiculopathy, cervical region: Principal | ICD-10-CM

## 2019-06-15 DIAGNOSIS — E079 Disorder of thyroid, unspecified: Secondary | ICD-10-CM

## 2019-06-15 DIAGNOSIS — K929 Disease of digestive system, unspecified: Secondary | ICD-10-CM

## 2019-06-15 DIAGNOSIS — M7918 Myalgia, other site: Secondary | ICD-10-CM

## 2019-06-15 DIAGNOSIS — H269 Unspecified cataract: Secondary | ICD-10-CM

## 2019-06-15 DIAGNOSIS — M5030 Other cervical disc degeneration, unspecified cervical region: Secondary | ICD-10-CM

## 2019-06-15 MED ORDER — LIDOCAINE (PF) 10 MG/ML (1 %) IJ SOLN
2 mL | Freq: Once | INTRAMUSCULAR | 0 refills | Status: CP
Start: 2019-06-15 — End: ?

## 2019-06-15 MED ORDER — TRIAMCINOLONE ACETONIDE 40 MG/ML IJ SUSP
80 mg | Freq: Once | EPIDURAL | 0 refills | Status: CP
Start: 2019-06-15 — End: ?

## 2019-06-15 MED ORDER — IOHEXOL 300 MG IODINE/ML IV SOLN
2 mL | Freq: Once | 0 refills | Status: CP
Start: 2019-06-15 — End: ?

## 2019-06-15 MED ORDER — DULOXETINE 60 MG PO CPDR
60 mg | ORAL_CAPSULE | Freq: Every day | ORAL | 11 refills | 60.00000 days | Status: AC
Start: 2019-06-15 — End: ?

## 2019-06-15 MED ORDER — LIDOCAINE (PF) 10 MG/ML (1 %) IJ SOLN
5 mL | Freq: Once | INTRAMUSCULAR | 0 refills | Status: CP | PRN
Start: 2019-06-15 — End: ?

## 2019-06-15 NOTE — Progress Notes
SPINE CENTER  INTERVENTIONAL PAIN PROCEDURE HISTORY AND PHYSICAL    No chief complaint on file.  Neck pain    HISTORY OF PRESENT ILLNESS:  Neck pain with radicular component. +myalgia    Medical History:   Diagnosis Date   ??? Arthritis    ??? Cataract    ??? Disorder of thyroid gland     hypothyroidectomy   ??? Gastrointestinal disorder     GERD   ??? Generalized headaches        Surgical History:   Procedure Laterality Date   ??? HX EYE SURGERY Right 1995    vitrectomy   ??? HX EYE SURGERY Right 1995    retinal reattachment   ??? HX EYE SURGERY Right 1997    Gas bubble   ??? HX EYE SURGERY Left 1998    retinal reattachment   ??? HERNIA REPAIR Bilateral 2009    inguinal   ??? HX CATARACT REMOVAL Right 2013   ??? LEFT LUMBAR 4-5 DISCECTOMY Left 03/08/2018    Performed by Emogene Morgan, MD at [facility] OR       family history includes Cancer in his brother, brother, father, sister, sister, and sister; Cataract in his mother; Hyperlipidemia in his mother; Hypertension in his mother; Thyroid Disease in his mother.    Social History     Socioeconomic History   ??? Marital status: Married     Spouse name: Not on file   ??? Number of children: Not on file   ??? Years of education: Not on file   ??? Highest education level: Not on file   Occupational History   ??? Not on file   Tobacco Use   ??? Smoking status: Former Smoker     Years: 4.00     Types: Cigarettes     Last attempt to quit: 01/15/1979     Years since quitting: 40.4   ??? Smokeless tobacco: Never Used   Substance and Sexual Activity   ??? Alcohol use: Yes     Comment: beer socially   ??? Drug use: No   ??? Sexual activity: Not on file   Other Topics Concern   ??? Not on file   Social History Narrative   ??? Not on file       Allergies   Allergen Reactions   ??? Albuterol RASH and EDEMA   ??? Aspirin ANAPHYLAXIS and EDEMA   ??? Ibuprofen ANAPHYLAXIS   ??? Ketorolac ANAPHYLAXIS   ??? Nsaids (Non-Steroidal Anti-Inflammatory Drug) ANAPHYLAXIS       There were no vitals filed for this visit. REVIEW OF SYSTEMS: 10 point ROS obtained and negative       PHYSICAL EXAM:    General: The patient is a well-developed, well nourished 63 y.o. male in no acute distress.   HEENT: Head is normocephalic and atraumatic.   Pulmonary: The patient has unlabored respirations and bilateral symmetric chest excursion.   Abdomen: Soft, nontender, and nondistended.   Extremities: No peripheral edema reported by patient or seen on video.     Neurologic:   The patient is alert and oriented times 3.     Musculoskeletal:     C-Spine   There is moderate paraspinal tenderness per patient assessment.        IMPRESSION:    1. Cervical radiculopathy    2. Myalgia, other site    3. DDD (degenerative disc disease), cervical         PLAN: Cervical Epidural Steroid Injection C7-T1 and TPI

## 2019-06-15 NOTE — Discharge Instructions - Supplementary Instructions
GENERAL POST PROCEDURE INSTRUCTIONS    Physician: _________________________________    Procedure Completed Today:  o Joint Injection (hip, knee, shoulder)  o Cervical Epidural Steroid Injection  o Cervical Transforaminal Steroid Injection  o Trigger Point Injection  o Other___________________________ o Thoracic Epidural Steroid Injection  o Lumbar Epidural Steroid Injection  o Lumbar Transforaminal Steroid Injection  o Facet Joint Injection     Important information following your procedure today:  o You may drive today     o If you had sedation, you may NOT drive today  ? Rest at home for the next 6 hours.  You may then begin to resume your normal activities.  ? DO NOT drive any vehicle, operate any power tools, drink alcohol, make any major decisions, or sign any legal documents for the next 12 hours.  1. Pain relief may not be immediate. It is possible you may even experience an increase in pain during the first 24-48 hours followed by a gradual decrease of your pain.  2. Though the procedure is generally safe and complications are rare, we do ask that you be aware of any of the following:  ? Any swelling, persistent redness, new bleeding or drainage from the site of the injection.  ? You should not experience a severe headache.  ? You should not run a fever over 101oF.  ? New onset of sharp, severe back and or neck pain.  ? New onset of upper or lower extremity numbness or weakness.  ? New difficulty controlling bowel or bladder function after injection.  ? New shortness of breath.  If any of these occur, please call to report this occurrence to a nurse at [phone]. If you are calling after 4:00pm, on the weekends or holidays please call [phone] and ask to have the pain management resident physician on call for the physician paged or go to your local emergency room.   3. You may experience soreness at the injection site. Ice can be applied at 20 minute intervals for the first 24 hours. The following day you may alternate ice with heat if you are experiencing muscle tightness, otherwise continue with ice. Ice works best at decreasing pain. Avoid application of direct heat, hot showers or hot tubs today.  4. Avoid strenuous activity today. You many resume your regular activities and exercise tomorrow.  5. Patients with diabetes may see an elevation in blood sugars for 7-10 days after the injection. It is important to pay close attention to your diet, check your blood sugars daily and repost extreme elevations to the physician that treats your diabetes.  6. Patients taking daily blood thinners can resume their regular dose this evening.  7. It is important that you take all medications ordered by your pain physician. Taking medications as ordered is an important part of you pain care plan. If you cannot continue the medication plan, please notify the physician.    Possible side effects to steroids that may occur:  ? Flushing or redness of the face  ? Irritability  ? Fluid retention  ? Change in women's menses      Follow up appointment as needed if in the event you are unable to keep an appointment please notify the scheduler 24 hours in advance at [phone]. If you have questions for the surgery center, call [facility] at [phone].

## 2019-06-15 NOTE — Procedures
Attending Surgeon: Evelina Bucy, MD    Anesthesia: Local    Pre-Procedure Diagnosis:   1. Cervical radiculopathy    2. Myalgia, other site    3. DDD (degenerative disc disease), cervical        Post-Procedure Diagnosis:   1. Cervical radiculopathy    2. Myalgia, other site    3. DDD (degenerative disc disease), cervical        Epidural Steroid Injection Cervical/Thoracic  Procedure: epidural - interlaminar    Laterality: n/a    Location: cervical ESI with imaging - C7-T1      Consent:   Consent obtained: written  Consent given by: patient  Risks discussed: allergic reaction, bleeding, bruising, infection, never damage, no change or worsening in pain, pneumo thorax, reaction to medication, seizure, swelling and weakness  Alternatives discussed: alternative treatment, delayed treatment and no treatment  Discussed with patient the purpose of the treatment/procedure, other ways of treating my condition, including no treatment/ procedure and the risks and benefits of the alternatives. Patient has decided to proceed with treatment/procedure.        Universal Protocol:  Relevant documents: relevant documents present and verified  Site marked: the operative site was marked  Patient identity confirmed: Patient identify confirmed verbally with patient.        Time out: Immediately prior to procedure a time out was called to verify the correct patient, procedure, equipment, support staff and site/side marked as required      Procedures Details:   Indications: pain   Prep: chlorhexidine  Patient position: prone  Estimated Blood Loss: minimal  Specimens: none  Amount Injected:   C7-T1: 4mL    Number of Levels: 1  Approach: midline  Guidance: fluoroscopy  Contrast: Procedure confirmed with contrast under live fluoroscopy.  Needle and Epidural Catheter: tuohy  Needle size: 18 G  Injection procedure: Incremental injection and Negative aspiration for blood  Patient tolerance: Patient tolerated the procedure well with no immediate complications. Pressure was applied, and hemostasis was accomplished.  Outcome: Pain unchanged  Comments:   CERVICAL INTERLAMINAR EPIDURAL STEROID INJECTION    PROCEDURE:  1) C7-T1 interlaminar epidural steroid injection  2) Fluoroscopic needle guidance    REASON FOR PROCEDURE: Cervical radiculopathy, DDD (cervical)    ATTENDING PHYSICIAN: Evelina Bucy, MD    MEDICATIONS INJECTED: 2 mL of triamciniolone (80 mg) and 2 mL of sterile, preservative-free normal saline    LOCAL ANESTHETIC INJECTED: 1 mL of 1% lidocaine    SEDATION MEDICATIONS: None    ESTIMATED BLOOD LOSS: None    SPECIMENS REMOVED: None    COMPLICATIONS: None    TECHNIQUE: Time-out was taken to identify the correct patient, procedure and side prior to starting the procedure. With the patient lying in a prone position with the neck in a slightly  flexed position, the area was prepped and draped in sterile fashion using DuraPrep and a fenestrated drape. The area was determined under fluoroscopic guidance. A 27-gauge, 1.25-inch  needle was used to anesthetize the needle entry site and subcutaneous tissues.     The 18-gauge, 3.5-inch Tuohy needle was advanced through the ligamentum flavum using loss of resistance technique. Once the tip of the needle was thought to be in the desired position, contrast was injected to confirm only epidural spread and no vascular runoff via A-P and lateral  views. The injectate was then injected slowly with intermittent negative aspiration.    The procedure was completed without complications and was tolerated well. The patient was monitored  after the procedure. The patient (or responsible party) was given post-procedure and  discharge instructions to follow at home. The patient was discharged in stable condition.    Evelina Bucy, MD, MBA      Trigger Point Injection  Locations: L cervical, R cervical, L levator scapulae, R levator scapulae, L upper trapezius and R upper trapezius  Consent:   Consent obtained: written Consent given by: patient  Alternatives discussed: alternative treatment, delayed treatment and no treatment  Discussed with patient the purpose of the treatment/procedure, other ways of treating my condition, including no treatment/ procedure and the risks and benefits of the alternatives. Patient has decided to proceed with treatment/procedure.        Universal Protocol:  Relevant documents: relevant documents present and verified  Site marked: the operative site was marked  Patient identity confirmed: Patient identify confirmed verbally with patient.        Time out: Immediately prior to procedure a time out was called to verify the correct patient, procedure, equipment, support staff and site/side marked as required      Procedures Details:   Indications: muscle spasm, myalgia and painPrep: alcohol  Needle size: 27 G  Number of muscles: 3 or more  Approach: posterior  Medications administered: 5 mL lidocaine PF 1% (10 mg/mL)  Patient tolerance: Patient tolerated the procedure well with no immediate complications. Pressure was applied, and hemostasis was accomplished.  Comments:   PROCEDURE: Trigger Point Injections    Pre-Procedure Diagnoses:  1. Myalgia, other  2. Myofascial pain  3. Muscle spasm    Post-Procedure Diagnoses:  Same    Physician: Evelina Bucy, MD    ANESTHESIA: Lidocaine 1%    COMPLICATIONS: None.    Location(s) of pain: As noted above  Presence of point tenderness in a tight band of muscle in above area: Yes  Restricted range of motion: Yes  Conservative therapy attempted for at least 6 weeks: activity modification, pharmacotherapy  Other components of comprehensive management: pharmacotherapy, activity modification    DESCRIPTION OF PROCEDURE: The procedure risks, hazards and alternatives were discussed with the patient and a proper consent was obtained. The area over each trigger point was prepped with alcohol utilizing sterile technique. After isolating it between two palpating fingertips a 27-gauge 1.5 needle was placed in the center of the myofascial spasms and a negative aspiration was performed. Then 0.5cc of the solution above was injected into each trigger point.     Trigger points in each of the muscle groups noted above were injected. A total of 5 ml of the local anesthetic solution was utilized.    The patient tolerated the procedure well without any apparent difficulties or complications.          Estimated blood loss: none or minimal  Specimens: none  Patient tolerated the procedure well with no immediate complications. Pressure was applied, and hemostasis was accomplished.

## 2019-07-18 ENCOUNTER — Encounter: Admit: 2019-07-18 | Discharge: 2019-07-18

## 2019-07-18 ENCOUNTER — Ambulatory Visit: Admit: 2019-07-18 | Discharge: 2019-07-19 | Payer: BC Managed Care – PPO

## 2019-09-15 ENCOUNTER — Encounter: Admit: 2019-09-15 | Discharge: 2019-09-15

## 2019-09-15 NOTE — Telephone Encounter
Patients spouse called requesting that her husband get an appointment to see Dr. Karsten Ro for increased low back and left leg pain.

## 2019-10-12 ENCOUNTER — Encounter: Admit: 2019-10-12 | Discharge: 2019-10-12

## 2019-10-12 ENCOUNTER — Ambulatory Visit: Admit: 2019-10-12 | Discharge: 2019-10-13 | Payer: BC Managed Care – PPO

## 2019-10-12 DIAGNOSIS — M1990 Unspecified osteoarthritis, unspecified site: Secondary | ICD-10-CM

## 2019-10-12 DIAGNOSIS — R519 Generalized headaches: Secondary | ICD-10-CM

## 2019-10-12 DIAGNOSIS — E079 Disorder of thyroid, unspecified: Secondary | ICD-10-CM

## 2019-10-12 DIAGNOSIS — K929 Disease of digestive system, unspecified: Secondary | ICD-10-CM

## 2019-10-12 DIAGNOSIS — M5442 Lumbago with sciatica, left side: Secondary | ICD-10-CM

## 2019-10-12 DIAGNOSIS — H269 Unspecified cataract: Secondary | ICD-10-CM

## 2019-10-12 NOTE — Progress Notes
HPI:  I had the pleasure of seeing Frank Jenkins in the office today.  Patient presents for a follow-up visit after having undergone a left-sided L4/5 decompression.  This provided good benefit initially, but he subsequently developed left leg pain recurrence which was very intense.  At the time he called for another appointment and ever since then his pain has gradually improved.  He is now much better than when he called for a subsequent appointment, but still has some discomfort at around a 4 out of 10.  He has been managed by Dr. Zollie Scale with injections in the past for both cervical and lumbar region so I encouraged him to call Dr. Precious Gilding office to undergo an injection in the lumbar spine.  I also encouraged him to consider physical therapy after he undergoes his injection so that he can hopefully be able to participate better and work on leg strength.  We will have him call us if he has any changes in his current status.      Exam:  AA  Conversant  Speech fluent  MAEW with good strength  CN II-XII GI  Sensation GI  Seems to favor the left lower extremity when he ambulates      Plan:  Plan is for the patient to consider having some more lumbar injections by Dr. Zollie Scale.  I want him to call us if he has any change in his functional status or significant worsening of his pain.      I spent 15 minutes in face to face contact. Over 50% of that time was spent in education and counseling. At the conclusion of our discussion she indicated that there were no further questions at this time.    Clydene Laming, MD  Assistant Professor, Department of Neurosurgery  [facility]

## 2019-10-12 NOTE — Telephone Encounter
Pt's spouse asks if pt can proceed to a repeat Left L4-5 and L5-S1 TFESI, last had in May. Pain is the same as when he had TFESI.    Had appt with Dr. Karsten Ro today and he recommended returning to Dr. Zollie Scale for injections.     Trying to get in before the end of the year so would like to go directly to injection     Route to [employer].

## 2019-10-13 ENCOUNTER — Encounter: Admit: 2019-10-13 | Discharge: 2019-10-13

## 2019-10-25 ENCOUNTER — Encounter: Admit: 2019-10-25 | Discharge: 2019-10-25

## 2019-10-25 DIAGNOSIS — R52 Pain, unspecified: Secondary | ICD-10-CM

## 2019-10-26 ENCOUNTER — Encounter: Admit: 2019-10-26 | Discharge: 2019-10-26

## 2019-10-26 ENCOUNTER — Ambulatory Visit: Admit: 2019-10-26 | Discharge: 2019-10-26 | Payer: BC Managed Care – PPO

## 2019-10-26 DIAGNOSIS — K929 Disease of digestive system, unspecified: Secondary | ICD-10-CM

## 2019-10-26 DIAGNOSIS — M1990 Unspecified osteoarthritis, unspecified site: Secondary | ICD-10-CM

## 2019-10-26 DIAGNOSIS — R52 Pain, unspecified: Secondary | ICD-10-CM

## 2019-10-26 DIAGNOSIS — R519 Generalized headaches: Secondary | ICD-10-CM

## 2019-10-26 DIAGNOSIS — M5417 Radiculopathy, lumbosacral region: Secondary | ICD-10-CM

## 2019-10-26 DIAGNOSIS — H269 Unspecified cataract: Secondary | ICD-10-CM

## 2019-10-26 DIAGNOSIS — E079 Disorder of thyroid, unspecified: Secondary | ICD-10-CM

## 2019-10-26 MED ORDER — DEXAMETHASONE SODIUM PHOS (PF) 10 MG/ML IJ SOLN
15 mg | Freq: Once | 0 refills | Status: CP
Start: 2019-10-26 — End: ?

## 2019-10-26 MED ORDER — LIDOCAINE (PF) 10 MG/ML (1 %) IJ SOLN
2 mL | Freq: Once | INTRAMUSCULAR | 0 refills | Status: CP
Start: 2019-10-26 — End: ?

## 2019-10-26 MED ORDER — IOHEXOL 300 MG IODINE/ML IV SOLN
2 mL | Freq: Once | 0 refills | Status: CP
Start: 2019-10-26 — End: ?

## 2019-10-26 NOTE — Discharge Instructions - Supplementary Instructions
GENERAL POST PROCEDURE INSTRUCTIONS  Physician: _________________________________  Procedure Completed Today:  o Joint Injection (hip, knee, shoulder)  o Cervical Epidural Steroid Injection  o Cervical Transforaminal Steroid Injection  o Trigger Point Injection  o Caudal Epidural Steroid Injection  o Pudendal Nerve Block  o Other _____________________ o Thoracic Epidural Steroid Injection  o Lumbar Epidural Steroid Injection  o Lumbar Transforaminal Steroid Injection  o Facet Joint Injection  o Celiac Nerve Block  o Sacrococcygeal  o Sacroiliac Joint Injection   Important information following your procedure today:  o You may drive today     o If you had sedation, you may NOT drive today  ? Rest at home for the next 6 hours.  You may then begin to resume your normal activities.  ? DO NOT drive any vehicle, operate any power tools, drink alcohol, make any major decisions, or sign any legal documents for the next 12 hours.  1. Pain relief may not be immediate. It is possible you may even experience an increase in pain during the first 24-48 hours followed by a gradual decrease of your pain.  2. Though the procedure is generally safe and complications are rare, we do ask that you be aware of any of the following:  ? Any swelling, persistent redness, new bleeding or drainage from the site of the injection.  ? You should not experience a severe headache.  ? You should not run a fever over 101oF.  ? New onset of sharp, severe back and or neck pain.  ? New onset of upper or lower extremity numbness or weakness.  ? New difficulty controlling bowel or bladder function after injection.  ? New shortness of breath.  If any of these occur, please call to report this occurrence to a nurse at [phone]. If you are calling after 4:00pm, on the weekends or holidays please call [phone] and ask to have the pain management resident physician on call for the physician paged or go to your local emergency room. 3. You may experience soreness at the injection site. Ice can be applied at 20 minute intervals for the first 24 hours. The following day you may alternate ice with heat if you are experiencing muscle tightness, otherwise continue with ice. Ice works best at decreasing pain. Avoid application of direct heat, hot showers or hot tubs today.  4. Avoid strenuous activity today. You many resume your regular activities and exercise tomorrow.  5. Patients with diabetes may see an elevation in blood sugars for 7-10 days after the injection. It is important to pay close attention to your diet, check your blood sugars daily and repost extreme elevations to the physician that treats your diabetes.  6. Patients taking daily blood thinners can resume their regular dose this evening.  7. It is important that you take all medications ordered by your pain physician. Taking medications as ordered is an important part of you pain care plan. If you cannot continue the medication plan, please notify the physician.    Possible side effects to steroids that may occur:  ? Flushing or redness of the face  ? Irritability  ? Fluid retention  ? Change in women's menses    Follow up appointment as needed if in the event you are unable to keep an appointment please notify the scheduler 24 hours in advance at [phone]. If you have questions for the surgery center, call [facility] at [phone].

## 2019-10-26 NOTE — Progress Notes
SPINE CENTER  INTERVENTIONAL PAIN PROCEDURE HISTORY AND PHYSICAL    No chief complaint on file.  LBP    HISTORY OF PRESENT ILLNESS:  LBP with RLE radicular pain    Medical History:   Diagnosis Date   ? Arthritis    ? Cataract    ? Disorder of thyroid gland     hypothyroidectomy   ? Gastrointestinal disorder     GERD   ? Generalized headaches        Surgical History:   Procedure Laterality Date   ? HX EYE SURGERY Right 1995    vitrectomy   ? HX EYE SURGERY Right 1995    retinal reattachment   ? HX EYE SURGERY Right 1997    Gas bubble   ? HX EYE SURGERY Left 1998    retinal reattachment   ? HERNIA REPAIR Bilateral 2009    inguinal   ? HX CATARACT REMOVAL Right 2013   ? LEFT LUMBAR 4-5 DISCECTOMY Left 03/08/2018    Performed by Emogene Morgan, MD at [facility] OR       family history includes Cancer in his brother, brother, father, sister, sister, and sister; Cataract in his mother; Hyperlipidemia in his mother; Hypertension in his mother; Thyroid Disease in his mother.    Social History     Socioeconomic History   ? Marital status: Married     Spouse name: Not on file   ? Number of children: Not on file   ? Years of education: Not on file   ? Highest education level: Not on file   Occupational History   ? Not on file   Tobacco Use   ? Smoking status: Former Smoker     Years: 4.00     Types: Cigarettes     Quit date: 01/15/1979     Years since quitting: 40.8   ? Smokeless tobacco: Never Used   Substance and Sexual Activity   ? Alcohol use: Yes     Comment: beer socially   ? Drug use: No   ? Sexual activity: Not on file   Other Topics Concern   ? Not on file   Social History Narrative   ? Not on file       Allergies   Allergen Reactions   ? Albuterol RASH and EDEMA   ? Aspirin ANAPHYLAXIS and EDEMA   ? Ibuprofen ANAPHYLAXIS   ? Ketorolac ANAPHYLAXIS   ? Nsaids (Non-Steroidal Anti-Inflammatory Drug) ANAPHYLAXIS       Vitals:    10/26/19 1225   BP: (!) 145/99   BP Source: Arm, Right Upper   Pulse: 82 Temp: 36.7 ?C (98.1 ?F)   SpO2: 98%   Weight: 92.9 kg (204 lb 14.4 oz)   Height: 177.8 cm (70)       REVIEW OF SYSTEMS: 10 point ROS obtained and negative       PHYSICAL EXAM:    General: Alert, cooperative, no distress  Head: Normocephalic, without obvious abnormality, atraumatic  Eyes: Conjunctivae/corneas clear  Lungs: Respirations regular and unlabored  Psychiatric: Mood and affect normal  Back/Spine: Low back paraspinal tenderness      IMPRESSION:    1. Lumbosacral radiculopathy         PLAN: Lumbar Transforaminal Steroid Injection Left L4-S1

## 2019-10-26 NOTE — Procedures
Attending Surgeon: Evelina Bucy, MD    Anesthesia: Local    Pre-Procedure Diagnosis:   1. Lumbosacral radiculopathy        Post-Procedure Diagnosis:   1. Lumbosacral radiculopathy        SNR/TF LMBR/SAC Injection  Procedure: transforaminal epidural    Laterality: left    Location: lumbar - L4-5 and L5-S1      Consent:   Consent obtained: written  Consent given by: patient  Risks discussed: allergic reaction, bleeding, bruising, infection, nerve damage, no change or worsening in pain, reaction to medication, seizure, swelling and weakness  Alternatives discussed: alternative treatment, delayed treatment and no treatment  Discussed with patient the purpose of the treatment/procedure, other ways of treating my condition, including no treatment/ procedure and the risks and benefits of the alternatives. Patient has decided to proceed with treatment/procedure.        Universal Protocol:  Relevant documents: relevant documents present and verified  Site marked: the operative site was marked  Patient identity confirmed: Patient identify confirmed verbally with patient.        Time out: Immediately prior to procedure a time out was called to verify the correct patient, procedure, equipment, support staff and site/side marked as required      Procedures Details:   Indications: pain   Prep: chlorhexidine  Patient position: prone  Estimated Blood Loss: minimal  Specimens: none  Number of Levels: 2  Guidance: fluoroscopy  Contrast: Procedure confirmed with contrast under live fluoroscopy.  Needle and Epidural Catheter: quincke  Needle size: 25 G  Injection procedure: Incremental injection and Negative aspiration for blood  Patient tolerance: Patient tolerated the procedure well with no immediate complications. Pressure was applied, and hemostasis was accomplished.  Outcome: Pain relieved  Comments:   LUMBAR/SACRAL TRANSFORAMINAL EPIDURAL STEROID INJECTION PROCEDURE: 1) left L4-5 and L5-S1 transforaminal epidural steroid injection  2) Fluoroscopic needle guidance    REASON FOR PROCEDURE: Lumbar radiculopathy    PHYSICIAN: Evelina Bucy, MD    MEDICATIONS INJECTED: 0.75 mL of Dexamethasone (7.5 mg) + 0.75 ml lidocaine 1% at each level    LOCAL ANESTHETIC INJECTED: 1 mL of 1% lidocaine per site    SEDATION MEDICATIONS: None    ESTIMATED BLOOD LOSS: None    SPECIMENS REMOVED: None    COMPLICATIONS: None    TECHNIQUE: Time-out was taken to identify the correct patient, procedure and side prior to starting the procedure. Lying in a prone position, the patient was prepped and draped in the usual sterile fashion using DuraPrep and a fenestrated drape. The area to be injected was determined under fluoroscopic guidance. Local anesthetic was given by raising a skin wheal and going down to the hub of a 27-gauge 1.25-inch needle.     The 3.5-inch 25-gauge Quincke needle was advanced toward the 6 o?clock position of the pedicle at each above-named nerve root level. The needle was advanced to the final position via a lateral fluoroscopic intermittent image. Omnipaque 240 was injected under live fluoroscopy and showed epidural spread and there was no vascular runoff. After a negative aspiration, the medication was then injected.     The procedure was completed without complications and was tolerated well. The patient was monitored after the procedure. The patient (or responsible party) was given post-procedure and discharge instructions to follow at home. The patient was discharged in stable condition.           Estimated blood loss: none or minimal  Specimens: none  Patient tolerated the procedure well  with no immediate complications. Pressure was applied, and hemostasis was accomplished.

## 2020-01-05 ENCOUNTER — Encounter: Admit: 2020-01-05 | Discharge: 2020-01-05

## 2020-01-05 NOTE — Telephone Encounter
Pt's wife reports that pt had COVID shot on 2/23 and two days later had pain in his back--to the point that he can't straighten up.   She is taking him to [facility] ER, but wants to know if she should come to [place].     LM that does not need to come to [place]. If pain persists, can make FUV with Dr. Lourdes Sledge. Last saw pt 10/26/19 for an injection.

## 2020-01-08 ENCOUNTER — Encounter: Admit: 2020-01-08 | Discharge: 2020-01-08

## 2020-01-08 NOTE — Telephone Encounter
Pt's wife reports that pt did go to [facility] ER on Friday and was admitted overnight for back pain. Received percocets and morphine. Was discharged Saturday with instructions to f/u with PCP.     Pt's wife continues to report that pt's back pain has steadily increased since COVID vaccine on 2/23 and asks for appt this week to see Dr. Lourdes Sledge.     ____________  Pt's wife calls scheduling and makes appt with [facility] for tomorrow.

## 2020-01-09 ENCOUNTER — Encounter: Admit: 2020-01-09 | Discharge: 2020-01-09

## 2020-01-09 ENCOUNTER — Ambulatory Visit: Admit: 2020-01-09 | Discharge: 2020-01-09 | Payer: BC Managed Care – PPO

## 2020-01-09 NOTE — Progress Notes
[facility] - Interventional Pain    Subjective     Chief Complaint: multifocal pain    Interval HPI: Frank Jenkins is a 64 y.o. male who  has a past medical history of Arthritis, Cataract, Disorder of thyroid gland, Gastrointestinal disorder, and Generalized headaches. who now presents for evaluation.    Patient reports he received the COVID vaccine about 2 weeks ago, and since then his right side of his low back then gradually worsened. Patient states he was evaluated in the local ED in [place], he was admitted for a couple of days for further evaluation. Patient reports he has been taking Flexeril and Percocet QID as prescribed by hospital provider.     Patient additionally reports he received CT scan, which showed a disc bulges L3-S1 yet no significant narrowing noted on right foraminal narrowing. It is difficult when looking at the report whether there is a degree of central stenosis.      The pain is in the across the low back, R>L, will radiate in posterolateral to thigh region on RLE  Patient has also noted anterior groin on right, intermittent   Will also radiate to anterior thigh on LLE  Denies pain past the knees bilaterally  Pain is constant  Numbness/tingling: yes - intermittent, noted in LLE  The pain ranges 2-6/10  The pain is described as ache, sore  The pain is exacerbated by walking > 20 yards, bending   The pain is partially alleviated by position changes/sitting, water   +muscle stiffness/tightness  Reports intermittent weakness in BLE, R>L    Currently taking Cymbalta 60mg  daily and reports moderate relief of pain, denies any SEs from medication use.    Hx of spine surgery: L4-L5 Discectomy - Dr. Lorel Monaco    Patient denies fevers, chills, infection, bleeding issues, anticoagulants, new muscle weakness, numbness, tingling, bowel/bladder incontinence, or saddle anesthesia.           PRIOR MEDICATIONS:   Effective  Cymbalta  Tizanidine (little)    Ineffective  Tylenol Flexeril      Unable to tolerate  NSAID    Never  Gabapentin  Lyrica  Ami/Nortriptyline      PRIOR INTERVENTIONS:  L-spine surgery 03/2018 at [place]  Effective  left L4-S1 TFESI   TPI  LESI (prior to surgery)    Ineffective  Exercise, limited by pain        Frank Jenkins denies any recent fevers, chills, infection, antibiotics, bowel or bladder incontinence, saddle anesthesia, bleeding issues, or recent anticoagulant.     ROS: All 14 systems reviewed and found to be negative except as above and as follows. +back pain, myalgia, joint pain, weakness.     Past Medical History:  Medical History:   Diagnosis Date   ? Arthritis    ? Cataract    ? Disorder of thyroid gland     hypothyroidectomy   ? Gastrointestinal disorder     GERD   ? Generalized headaches        Family History:  Family History   Problem Relation Age of Onset   ? Cataract Mother    ? Hypertension Mother    ? Hyperlipidemia Mother    ? Thyroid Disease Mother    ? Cancer Father         lung   ? Cancer Sister         breast   ? Cancer Brother         multiple myeloma   ?  Cancer Sister         breast   ? Cancer Sister         breast   ? 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   Social History     Socioeconomic History   ? Marital status: Married     Spouse name: Not on file   ? Number of children: Not on file   ? Years of education: Not on file   ? Highest education level: Not on file   Occupational History   ? Not on file   Tobacco Use   ? Smoking status: Former Smoker     Years: 4.00     Types: Cigarettes     Quit date: 01/15/1979     Years since quitting: 41.0   ? Smokeless tobacco: Never Used   Substance and Sexual Activity   ? Alcohol use: Yes     Comment: beer socially   ? Drug use: No   ? Sexual activity: Not on file   Other Topics Concern   ? Not on file   Social History Narrative   ? Not on file       Allergies:  Allergies   Allergen Reactions   ? Albuterol RASH and EDEMA   ? Aspirin ANAPHYLAXIS and EDEMA   ? Ibuprofen ANAPHYLAXIS   ? Ketorolac ANAPHYLAXIS   ? Nsaids (Non-Steroidal Anti-Inflammatory Drug) ANAPHYLAXIS       Medications:    Current Outpatient Medications:   ?  acetaminophen (TYLENOL) 500 mg tablet, Take 500 mg by mouth every 6 hours as needed for Pain. Max of 4,000 mg of acetaminophen in 24 hours., Disp: , Rfl:   ?  cyclobenzaprine (FLEXERIL) 10 mg tablet, Take 10 mg by mouth three times daily as needed for Muscle Cramps., Disp: , Rfl:   ?  duloxetine DR (CYMBALTA) 60 mg capsule, Take one capsule by mouth daily., Disp: 30 capsule, Rfl: 11  ?  ergocalciferol (vitamin D2) (VITAMIN D PO), Take  by mouth daily., Disp: , Rfl:   ?  hypromellose/dextran (ARTIFICIAL TEARS(DEXT70-HYPRO)) drop, Instill 1 drop into both eyes daily as needed, Disp: , Rfl:   ?  levothyroxine (SYNTHROID) 50 mcg tablet, Take 50 mcg by mouth daily 30 minutes before breakfast., Disp: , Rfl:   ?  methyl salicylate/menthol (BENGAY TP), Apply  topically to affected area daily as needed., Disp: , Rfl:   ?  oxyCODONE/acetaminophen (PERCOCET) 5/325 mg tablet, Take 1 tablet by mouth every 6 hours as needed for Pain, Disp: , Rfl:   ?  pantoprazole DR (PROTONIX) 40 mg tablet, Take 40 mg by mouth every morning., Disp: , Rfl:   ?  predniSONE (DELTASONE) 20 mg tablet, Take 20 mg by mouth daily with breakfast., Disp: , Rfl:     Physical examination:   BP (!) 154/96  - Pulse 91  - Temp 36.9 ?C (98.4 ?F)  - Ht 177.8 cm (70)  - Wt 92.5 kg (204 lb)  - SpO2 99%  - BMI 29.27 kg/m?   Pain Score: Four      General: The patient is a well-developed, well nourished 64 y.o. male in no acute distress.   HEENT: Head is normocephalic and atraumatic.   Pulmonary: The patient has unlabored respirations and bilateral symmetric chest excursion.   Abdomen: Soft, nontender, and nondistended. There  is no rebound or guarding reported with patient palpation.   Extremities: No peripheral edema noted.    Neurologic:   The patient is alert and oriented times 3.   Cranial nerves II through XII are intact without any focal deficits.   +fine tremor in BUE    Musculoskeletal:   Gait is mildly antalgic.     L-Spine   There is moderate-severe R>L low lumbar paraspinal tenderness. Paraspinal muscle tone is increased.   ROM with flexion, extension, rotation, and lateral bending is intact but stiff.  FABER positive bilaterally, +thigh thrust on right, moderate pain with internal hip rotation on right.   Strength is equal and adequate bilaterally in the flexors and extensors of the bilateral lower extremities.   SLR positive bilaterally, R>L.          MRI C-Spine  04/2018  [facility]    C2-3 Min DB.   C3-4 Mild broad DB. L NFS. +FA.   C4-5 Broad DB. +FA. Bil NFS mod-sev R and mod L.   C5-6 Mild L FA. Mil L NFS        Last Cr and LFT's:  Creatinine   Date Value Ref Range Status   02/18/2018 0.91 0.4 - 1.24 MG/DL Final          Assessment:    Frank Jenkins is a 64 y.o. male who  has a past medical history of Arthritis, Cataract, Disorder of thyroid gland, Gastrointestinal disorder, and Generalized headaches. who presents for evaluation of pain.    The pain complaints are most likely due to:    1. Lumbosacral radiculopathy  MRI L-SPINE WO CONTRAST    [place] [facility] SPINE INJECT SNRB/TFESI LUMBAR/SACRAL   2. DDD (degenerative disc disease), cervical  MRI L-SPINE WO CONTRAST    [place] [facility] SPINE INJECT SNRB/TFESI LUMBAR/SACRAL   3. Facet arthropathy  MRI L-SPINE WO CONTRAST   4. Myalgia, other site     5. Right hip pain  HIP 2-3 VIEWS W PELVIS RT       Patient has had an adequate trial of > 3 months of rest, exercise, multimodal treatment, and the passage of time without improvement of symptoms. The pain has significant impact on the daily quality of life.     Plan:    1. Discussed plan of care options with patient and reviewed previou Will schedule for right L4-S1 TFESI at next available appointment.  2. Due to persistent severe pain, will obtain updated L-spine MRI wo contrast.  3. Will obtain right hip xray today.   4. Will provide a return to work letter for this Monday, 01/15/2020.   5. Continue Cymbalta as prescribed. Discussed risks and benefits of medication therapy and possible SEs, patient verbalized understanding and all questions answered.   6. May consider trial of Gabapentin in future, patient wishes to defer at this time due to uncertainty with medication and his job.   7. Follow up after procedure.     Risks/benefits of all pharmacologic and interventional treatments discussed and questions answered.       Total time 40 minutes. Estimated counseling time 20 minutes. Counseled patient regarding interventional options.

## 2020-01-09 NOTE — Patient Instructions
General Instructions:   How to reach me: Please send a MyChart message to the Spine Center or leave a voicemail for my nurse Rebecca at [phone].   Scheduling: Our scheduling phone number is [phone].   How to get a medication refill: Five business days before refill needed, please use the MyChart Refill request or contact your pharmacy directly to request medication refills.    How to receive your test results: If you have signed up for MyChart, you will receive your test results and messages from me this way. Otherwise, you will get a phone call or letter. If you are expecting results and have not heard from my office within 2 weeks of your testing, please send a MyChart message or call my office.   Support for many chronic illnesses is available through [facility]: [email or web address] or [phone].   For questions on nights, weekends or holidays, call the operator at [phone], and ask for the doctor on call for Anesthesia Pain Management.  

## 2020-01-11 ENCOUNTER — Encounter: Admit: 2020-01-11 | Discharge: 2020-01-11

## 2020-01-18 ENCOUNTER — Encounter: Admit: 2020-01-18 | Discharge: 2020-01-18

## 2020-01-18 DIAGNOSIS — M5412 Radiculopathy, cervical region: Secondary | ICD-10-CM

## 2020-01-23 ENCOUNTER — Encounter: Admit: 2020-01-23 | Discharge: 2020-01-23

## 2020-01-25 ENCOUNTER — Encounter: Admit: 2020-01-25 | Discharge: 2020-01-25

## 2020-02-01 ENCOUNTER — Encounter: Admit: 2020-02-01 | Discharge: 2020-02-01

## 2020-02-01 ENCOUNTER — Ambulatory Visit: Admit: 2020-02-01 | Discharge: 2020-02-01

## 2020-02-01 ENCOUNTER — Ambulatory Visit: Admit: 2020-02-01 | Discharge: 2020-02-01 | Payer: BC Managed Care – PPO

## 2020-02-01 DIAGNOSIS — M5030 Other cervical disc degeneration, unspecified cervical region: Secondary | ICD-10-CM

## 2020-02-01 DIAGNOSIS — M47819 Spondylosis without myelopathy or radiculopathy, site unspecified: Secondary | ICD-10-CM

## 2020-02-01 DIAGNOSIS — M5412 Radiculopathy, cervical region: Secondary | ICD-10-CM

## 2020-02-01 DIAGNOSIS — M5417 Radiculopathy, lumbosacral region: Secondary | ICD-10-CM

## 2020-02-01 MED ORDER — GADOBENATE DIMEGLUMINE 529 MG/ML (0.1MMOL/0.2ML) IV SOLN
19 mL | Freq: Once | INTRAVENOUS | 0 refills | Status: CP
Start: 2020-02-01 — End: ?
  Administered 2020-02-01: 21:00:00 19 mL via INTRAVENOUS

## 2020-02-05 ENCOUNTER — Encounter: Admit: 2020-02-05 | Discharge: 2020-02-05

## 2020-02-06 ENCOUNTER — Ambulatory Visit: Admit: 2020-02-06 | Discharge: 2020-02-06

## 2020-02-06 ENCOUNTER — Encounter: Admit: 2020-02-06 | Discharge: 2020-02-06

## 2020-02-06 ENCOUNTER — Ambulatory Visit: Admit: 2020-02-06 | Discharge: 2020-02-06 | Payer: BC Managed Care – PPO

## 2020-02-06 DIAGNOSIS — R519 Generalized headaches: Secondary | ICD-10-CM

## 2020-02-06 DIAGNOSIS — M5030 Other cervical disc degeneration, unspecified cervical region: Secondary | ICD-10-CM

## 2020-02-06 DIAGNOSIS — K929 Disease of digestive system, unspecified: Secondary | ICD-10-CM

## 2020-02-06 DIAGNOSIS — M1990 Unspecified osteoarthritis, unspecified site: Secondary | ICD-10-CM

## 2020-02-06 DIAGNOSIS — H269 Unspecified cataract: Secondary | ICD-10-CM

## 2020-02-06 DIAGNOSIS — E079 Disorder of thyroid, unspecified: Secondary | ICD-10-CM

## 2020-02-06 DIAGNOSIS — M5417 Radiculopathy, lumbosacral region: Secondary | ICD-10-CM

## 2020-02-06 MED ORDER — IOHEXOL 300 MG IODINE/ML IV SOLN
2 mL | Freq: Once | 0 refills | Status: AC
Start: 2020-02-06 — End: ?

## 2020-02-06 MED ORDER — DEXAMETHASONE SODIUM PHOS (PF) 10 MG/ML IJ SOLN
15 mg | Freq: Once | 0 refills | Status: AC
Start: 2020-02-06 — End: ?

## 2020-02-06 MED ORDER — LIDOCAINE (PF) 10 MG/ML (1 %) IJ SOLN
2 mL | Freq: Once | INTRAMUSCULAR | 0 refills | Status: AC
Start: 2020-02-06 — End: ?

## 2020-02-06 NOTE — Progress Notes
I have examined the patient, and there are no significant changes in their condition, from the previous H&P performed on 01/09/20.     Right L4-S1 TFESI        [facility]    Subjective     Chief Complaint: multifocal pain    Interval HPI: Frank Jenkins is a 64 y.o. male who  has a past medical history of Arthritis, Cataract, Disorder of thyroid gland, Gastrointestinal disorder, and Generalized headaches. who now presents for evaluation.    Patient reports he received the COVID vaccine about 2 weeks ago, and since then his right side of his low back then gradually worsened. Patient states he was evaluated in the local ED in [place], he was admitted for a couple of days for further evaluation. Patient reports he has been taking Flexeril and Percocet QID as prescribed by hospital provider.     Patient additionally reports he received CT scan, which showed a disc bulges L3-S1 yet no significant narrowing noted on right foraminal narrowing. It is difficult when looking at the report whether there is a degree of central stenosis.      The pain is in the across the low back, R>L, will radiate in posterolateral to thigh region on RLE  Patient has also noted anterior groin on right, intermittent   Will also radiate to anterior thigh on LLE  Denies pain past the knees bilaterally  Pain is constant  Numbness/tingling: yes - intermittent, noted in LLE  The pain ranges 2-6/10  The pain is described as ache, sore  The pain is exacerbated by walking > 20 yards, bending   The pain is partially alleviated by position changes/sitting, water   +muscle stiffness/tightness  Reports intermittent weakness in BLE, R>L    Currently taking Cymbalta 60mg  daily and reports moderate relief of pain, denies any SEs from medication use.    Hx of spine surgery: L4-L5 Discectomy - Dr. Lorel Monaco    Patient denies fevers, chills, infection, bleeding issues, anticoagulants, new muscle weakness, numbness, tingling, bowel/bladder incontinence, or saddle anesthesia.           PRIOR MEDICATIONS:   Effective  Cymbalta  Tizanidine (little)    Ineffective  Tylenol  Flexeril      Unable to tolerate  NSAID    Never  Gabapentin  Lyrica  Ami/Nortriptyline      PRIOR INTERVENTIONS:  L-spine surgery 03/2018 at [place]  Effective  left L4-S1 TFESI   TPI  LESI (prior to surgery)    Ineffective  Exercise, limited by pain        Frank Jenkins denies any recent fevers, chills, infection, antibiotics, bowel or bladder incontinence, saddle anesthesia, bleeding issues, or recent anticoagulant.     ROS: All 14 systems reviewed and found to be negative except as above and as follows. +back pain, myalgia, joint pain, weakness.     Past Medical History:  Medical History:   Diagnosis Date   ? Arthritis    ? Cataract    ? Disorder of thyroid gland     hypothyroidectomy   ? Gastrointestinal disorder     GERD   ? Generalized headaches        Family History:  Family History   Problem Relation Age of Onset   ? Cataract Mother    ? Hypertension Mother    ? Hyperlipidemia Mother    ? Thyroid Disease Mother    ? Cancer Father  lung   ? Cancer Sister         breast   ? Cancer Brother         multiple myeloma   ? Cancer Sister         breast   ? Cancer Sister         breast   ? 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   Social History     Socioeconomic History   ? Marital status: Married     Spouse name: Not on file   ? Number of children: Not on file   ? Years of education: Not on file   ? Highest education level: Not on file   Occupational History   ? Not on file   Tobacco Use   ? Smoking status: Former Smoker     Years: 4.00     Types: Cigarettes     Quit date: 01/15/1979     Years since quitting: 41.0   ? Smokeless tobacco: Never Used   Substance and Sexual Activity   ? Alcohol use: Yes     Comment: beer socially   ? Drug use: No   ? Sexual activity: Not on file Other Topics Concern   ? Not on file   Social History Narrative   ? Not on file       Allergies:  Allergies   Allergen Reactions   ? Albuterol RASH and EDEMA   ? Aspirin ANAPHYLAXIS and EDEMA   ? Ibuprofen ANAPHYLAXIS   ? Ketorolac ANAPHYLAXIS   ? Nsaids (Non-Steroidal Anti-Inflammatory Drug) ANAPHYLAXIS       Medications:    Current Outpatient Medications:   ?  acetaminophen (TYLENOL) 500 mg tablet, Take 500 mg by mouth every 6 hours as needed for Pain. Max of 4,000 mg of acetaminophen in 24 hours., Disp: , Rfl:   ?  cyclobenzaprine (FLEXERIL) 10 mg tablet, Take 10 mg by mouth three times daily as needed for Muscle Cramps., Disp: , Rfl:   ?  duloxetine DR (CYMBALTA) 60 mg capsule, Take one capsule by mouth daily., Disp: 30 capsule, Rfl: 11  ?  ergocalciferol (vitamin D2) (VITAMIN D PO), Take  by mouth daily., Disp: , Rfl:   ?  hypromellose/dextran (ARTIFICIAL TEARS(DEXT70-HYPRO)) drop, Instill 1 drop into both eyes daily as needed, Disp: , Rfl:   ?  levothyroxine (SYNTHROID) 50 mcg tablet, Take 50 mcg by mouth daily 30 minutes before breakfast., Disp: , Rfl:   ?  methyl salicylate/menthol (BENGAY TP), Apply  topically to affected area daily as needed., Disp: , Rfl:   ?  oxyCODONE/acetaminophen (PERCOCET) 5/325 mg tablet, Take 1 tablet by mouth every 6 hours as needed for Pain, Disp: , Rfl:   ?  pantoprazole DR (PROTONIX) 40 mg tablet, Take 40 mg by mouth every morning., Disp: , Rfl:   ?  predniSONE (DELTASONE) 20 mg tablet, Take 20 mg by mouth daily with breakfast., Disp: , Rfl:     Current Facility-Administered Medications:   ?  dexamethasone PF (DECADRON) injection 15 mg, 15 mg, SEE ADMIN INSTRUCTIONS, ONCE, Ashira Kirsten, MD  ?  iohexoL (OMNIPAQUE-300) 300 mg/mL injection 2 mL, 2 mL, SEE ADMIN INSTRUCTIONS, ONCE, Caliyah Sieh, MD  ?  lidocaine PF 1% (10 mg/mL) injection 2 mL, 2 mL, Injection, ONCE, Evelina Bucy, MD    Physical  examination:   There were no vitals taken for this visit.         General: The patient is a well-developed, well nourished 64 y.o. male in no acute distress.   HEENT: Head is normocephalic and atraumatic.   Pulmonary: The patient has unlabored respirations and bilateral symmetric chest excursion.   Abdomen: Soft, nontender, and nondistended. There is no rebound or guarding reported with patient palpation.   Extremities: No peripheral edema noted.    Neurologic:   The patient is alert and oriented times 3.   Cranial nerves II through XII are intact without any focal deficits.   +fine tremor in BUE    Musculoskeletal:   Gait is mildly antalgic.     L-Spine   There is moderate-severe R>L low lumbar paraspinal tenderness. Paraspinal muscle tone is increased.   ROM with flexion, extension, rotation, and lateral bending is intact but stiff.  FABER positive bilaterally, +thigh thrust on right, moderate pain with internal hip rotation on right.   Strength is equal and adequate bilaterally in the flexors and extensors of the bilateral lower extremities.   SLR positive bilaterally, R>L.          MRI C-Spine  04/2018  [facility]    C2-3 Min DB.   C3-4 Mild broad DB. L NFS. +FA.   C4-5 Broad DB. +FA. Bil NFS mod-sev R and mod L.   C5-6 Mild L FA. Mil L NFS        Last Cr and LFT's:  Creatinine   Date Value Ref Range Status   02/18/2018 0.91 0.4 - 1.24 MG/DL Final          Assessment:    Frank Jenkins is a 64 y.o. male who  has a past medical history of Arthritis, Cataract, Disorder of thyroid gland, Gastrointestinal disorder, and Generalized headaches. who presents for evaluation of pain.    The pain complaints are most likely due to:    1. Lumbosacral radiculopathy  [place] [facility] SP[facility] INJECT SNRB/TFESI LUMBAR/SACRAL    [place] [facility] SPINE INJECT SNRB/TFESI LUMBAR/SACRAL    lidocaine PF 1% (10 mg/mL) injection 2 mL    iohexoL (OMNIPAQUE-300) 300 mg/mL injection 2 mL    dexamethasone PF (DECADRON) injection 15 mg   2. DDD (degenerative disc disease), cervical  [place] [facility] INJECT SNRB/TFESI LUMBAR/SACRAL    [place] [facility] SPINE INJECT SNRB/TFESI LUMBAR/SACRAL    lidocaine PF 1% (10 mg/mL) injection 2 mL    iohexoL (OMNIPAQUE-300) 300 mg/mL injection 2 mL    dexamethasone PF (DECADRON) injection 15 mg       Patient has had an adequate trial of > 3 months of rest, exercise, multimodal treatment, and the passage of time without improvement of symptoms. The pain has significant impact on the daily quality of life.     Plan:    1. Discussed plan of care options with patient and reviewed previou Will schedule for right L4-S1 TFESI at next available appointment.  2. Due to persistent severe pain, will obtain updated L-spine MRI wo contrast.  3. Will obtain right hip xray today.   4. Will provide a return to work letter for this Monday, 01/15/2020.   5. Continue Cymbalta as prescribed. Discussed risks and benefits of medication therapy and possible SEs, patient verbalized understanding and all questions answered.   6. May consider trial of Gabapentin in future, patient wishes to defer at this time due to uncertainty with medication and his job.   7. Follow up  after procedure.     Risks/benefits of all pharmacologic and interventional treatments discussed and questions answered.       Total time 40 minutes. Estimated counseling time 20 minutes. Counseled patient regarding interventional options.

## 2020-02-06 NOTE — Procedures
Attending Surgeon: Evelina Bucy, MD    Anesthesia: Local    Pre-Procedure Diagnosis:   1. Lumbosacral radiculopathy    2. DDD (degenerative disc disease), cervical        Post-Procedure Diagnosis:   1. Lumbosacral radiculopathy    2. DDD (degenerative disc disease), cervical        SNR/TF LMBR/SAC Injection  Procedure: transforaminal epidural    Laterality: right    Location: lumbar - L4-5 and L5-S1      Consent:   Consent obtained: written  Consent given by: patient  Risks discussed: allergic reaction, bleeding, bruising, infection, nerve damage, no change or worsening in pain, reaction to medication, seizure, swelling and weakness  Alternatives discussed: alternative treatment, delayed treatment and no treatment  Discussed with patient the purpose of the treatment/procedure, other ways of treating my condition, including no treatment/ procedure and the risks and benefits of the alternatives. Patient has decided to proceed with treatment/procedure.        Universal Protocol:  Relevant documents: relevant documents present and verified  Site marked: the operative site was marked  Patient identity confirmed: Patient identify confirmed verbally with patient.        Time out: Immediately prior to procedure a time out was called to verify the correct patient, procedure, equipment, support staff and site/side marked as required      Procedures Details:   Indications: pain   Prep: chlorhexidine  Patient position: prone  Estimated Blood Loss: minimal  Specimens: none  Number of Levels: 2  Guidance: fluoroscopy  Contrast: Procedure confirmed with contrast under live fluoroscopy.  Needle and Epidural Catheter: quincke  Needle size: 25 G  Injection procedure: Incremental injection and Negative aspiration for blood  Patient tolerance: Patient tolerated the procedure well with no immediate complications. Pressure was applied, and hemostasis was accomplished.  Outcome: Pain relieved  Comments:   LUMBAR/SACRAL TRANSFORAMINAL EPIDURAL STEROID INJECTION PROCEDURE:    1) right L4-5 and L5-S1 transforaminal epidural steroid injection  2) Fluoroscopic needle guidance    REASON FOR PROCEDURE: Lumbar radiculopathy    PHYSICIAN: Evelina Bucy, MD    MEDICATIONS INJECTED: 0.75 mL of Dexamethasone (7.5 mg) + 0.75 ml lidocaine 1% at each level    LOCAL ANESTHETIC INJECTED: 1 mL of 1% lidocaine per site    SEDATION MEDICATIONS: None    ESTIMATED BLOOD LOSS: None    SPECIMENS REMOVED: None    COMPLICATIONS: None    TECHNIQUE: Time-out was taken to identify the correct patient, procedure and side prior to starting the procedure. Lying in a prone position, the patient was prepped and draped in the usual sterile fashion using DuraPrep and a fenestrated drape. The area to be injected was determined under fluoroscopic guidance. Local anesthetic was given by raising a skin wheal and going down to the hub of a 27-gauge 1.25-inch needle.     The 3.5-inch 25-gauge Quincke needle was advanced toward the 6 o?clock position of the pedicle at each above-named nerve root level. The needle was advanced to the final position via a lateral fluoroscopic intermittent image. Omnipaque 240 was injected under live fluoroscopy and showed epidural spread and there was no vascular runoff. After a negative aspiration, the medication was then injected.     The procedure was completed without complications and was tolerated well. The patient was monitored after the procedure. The patient (or responsible party) was given post-procedure and discharge instructions to follow at home. The patient was discharged in stable condition.  Estimated blood loss: none or minimal  Specimens: none  Patient tolerated the procedure well with no immediate complications. Pressure was applied, and hemostasis was accomplished.

## 2020-02-06 NOTE — Discharge Instructions - Supplementary Instructions
GENERAL POST PROCEDURE INSTRUCTIONS  Physician: _________________________________  Procedure Completed Today:  o Joint Injection (hip, knee, shoulder)  o Cervical Epidural Steroid Injection  o Cervical Transforaminal Steroid Injection  o Trigger Point Injection  o Caudal Epidural Steroid Injection  o Pudendal Nerve Block  o Other _____________________ o Thoracic Epidural Steroid Injection  o Lumbar Epidural Steroid Injection  o Lumbar Transforaminal Steroid Injection  o Facet Joint Injection  o Celiac Nerve Block  o Sacrococcygeal  o Sacroiliac Joint Injection   Important information following your procedure today:  o You may drive today     o If you had sedation, you may NOT drive today  - Rest at home for the next 6 hours.  You may then begin to resume your normal activities.  - DO NOT drive any vehicle, operate any power tools, drink alcohol, make any major decisions, or sign any legal documents for the next 12 hours.  1. Pain relief may not be immediate. It is possible you may even experience an increase in pain during the first 24-48 hours followed by a gradual decrease of your pain.  2. Though the procedure is generally safe and complications are rare, we do ask that you be aware of any of the following:  ? Any swelling, persistent redness, new bleeding or drainage from the site of the injection.  ? You should not experience a severe headache.  ? You should not run a fever over 101oF.  ? New onset of sharp, severe back and or neck pain.  ? New onset of upper or lower extremity numbness or weakness.  ? New difficulty controlling bowel or bladder function after injection.  ? New shortness of breath.  If any of these occur, please call to report this occurrence to a nurse at [phone]. If you are calling after 4:00pm, on the weekends or holidays please call [phone] and ask to have the pain management resident physician on call for the physician paged or go to your local emergency room.   3. You may experience soreness at the injection site. Ice can be applied at 20 minute intervals for the first 24 hours. The following day you may alternate ice with heat if you are experiencing muscle tightness, otherwise continue with ice. Ice works best at decreasing pain. Avoid application of direct heat, hot showers or hot tubs today.  4. Avoid strenuous activity today. You many resume your regular activities and exercise tomorrow.  5. Patients with diabetes may see an elevation in blood sugars for 7-10 days after the injection. It is important to pay close attention to your diet, check your blood sugars daily and report extreme elevations to the physician that manages your diabetes.  6. Patients taking daily blood thinners can resume their regular dose this evening.  7. It is important that you take all medications ordered by your pain physician. Taking medications as ordered is an important part of you pain care plan. If you cannot continue the medication plan, please notify the physician.    Possible side effects to steroids that may occur:  ? Flushing or redness of the face  ? Irritability  ? Fluid retention  ? Change in women's menses    Follow up appointment as needed if in the event you are unable to keep an appointment please notify the scheduler 24 hours in advance at [phone]. If you have questions for the surgery center, call [facility] at [phone].

## 2020-03-13 ENCOUNTER — Encounter: Admit: 2020-03-13 | Discharge: 2020-03-13

## 2020-07-02 ENCOUNTER — Encounter: Admit: 2020-07-02 | Discharge: 2020-07-02

## 2020-07-02 MED ORDER — DULOXETINE 60 MG PO CPDR
60 mg | ORAL_CAPSULE | Freq: Every day | ORAL | 5 refills | 60.00000 days | Status: AC
Start: 2020-07-02 — End: ?

## 2020-07-03 ENCOUNTER — Encounter: Admit: 2020-07-03 | Discharge: 2020-07-03

## 2020-07-03 MED ORDER — DULOXETINE 60 MG PO CPDR
60 mg | ORAL_CAPSULE | Freq: Every day | ORAL | 5 refills | 60.00000 days | Status: AC
Start: 2020-07-03 — End: ?

## 2020-09-17 ENCOUNTER — Ambulatory Visit: Admit: 2020-09-17 | Discharge: 2020-09-18 | Payer: BC Managed Care – PPO

## 2020-09-17 ENCOUNTER — Encounter: Admit: 2020-09-17 | Discharge: 2020-09-17

## 2020-09-17 DIAGNOSIS — Z20822 Encounter for screening laboratory testing for COVID-19 virus in asymptomatic patient: Secondary | ICD-10-CM

## 2020-09-17 DIAGNOSIS — K929 Disease of digestive system, unspecified: Secondary | ICD-10-CM

## 2020-09-17 DIAGNOSIS — K219 Gastro-esophageal reflux disease without esophagitis: Secondary | ICD-10-CM

## 2020-09-17 DIAGNOSIS — R519 Generalized headaches: Secondary | ICD-10-CM

## 2020-09-17 DIAGNOSIS — K5000 Crohn's disease of small intestine without complications: Secondary | ICD-10-CM

## 2020-09-17 DIAGNOSIS — E079 Disorder of thyroid, unspecified: Secondary | ICD-10-CM

## 2020-09-17 DIAGNOSIS — R1084 Generalized abdominal pain: Secondary | ICD-10-CM

## 2020-09-17 DIAGNOSIS — H269 Unspecified cataract: Secondary | ICD-10-CM

## 2020-09-17 DIAGNOSIS — Z800 Family history of malignant neoplasm of digestive organs: Secondary | ICD-10-CM

## 2020-09-17 DIAGNOSIS — Z7689 Persons encountering health services in other specified circumstances: Secondary | ICD-10-CM

## 2020-09-17 DIAGNOSIS — R1319 Other dysphagia: Secondary | ICD-10-CM

## 2020-09-17 DIAGNOSIS — M1990 Unspecified osteoarthritis, unspecified site: Secondary | ICD-10-CM

## 2020-09-17 DIAGNOSIS — Z86010 Personal history of colonic polyps: Secondary | ICD-10-CM

## 2020-09-17 DIAGNOSIS — R197 Diarrhea, unspecified: Secondary | ICD-10-CM

## 2020-09-17 MED ORDER — FAMOTIDINE 40 MG PO TAB
40 mg | ORAL_TABLET | Freq: Every evening | ORAL | 3 refills | 90.00000 days | Status: AC
Start: 2020-09-17 — End: ?

## 2020-09-17 MED ORDER — LACTATED RINGERS IV SOLP
INTRAVENOUS | 0 refills
Start: 2020-09-17 — End: ?

## 2020-09-18 ENCOUNTER — Encounter: Admit: 2020-09-18 | Discharge: 2020-09-18

## 2020-09-18 ENCOUNTER — Ambulatory Visit: Admit: 2020-09-18 | Discharge: 2020-09-18 | Payer: BC Managed Care – PPO

## 2020-09-18 DIAGNOSIS — E079 Disorder of thyroid, unspecified: Secondary | ICD-10-CM

## 2020-09-18 DIAGNOSIS — M1990 Unspecified osteoarthritis, unspecified site: Secondary | ICD-10-CM

## 2020-09-18 DIAGNOSIS — K929 Disease of digestive system, unspecified: Secondary | ICD-10-CM

## 2020-09-18 DIAGNOSIS — H269 Unspecified cataract: Secondary | ICD-10-CM

## 2020-09-18 DIAGNOSIS — R112 Nausea with vomiting, unspecified: Secondary | ICD-10-CM

## 2020-09-18 DIAGNOSIS — K219 Gastro-esophageal reflux disease without esophagitis: Secondary | ICD-10-CM

## 2020-09-18 DIAGNOSIS — R519 Generalized headaches: Secondary | ICD-10-CM

## 2020-09-19 ENCOUNTER — Encounter: Admit: 2020-09-19 | Discharge: 2020-09-19

## 2020-09-19 NOTE — Progress Notes
GI Prevention Clinic Intake Document      Patient Name: Frank Jenkins "[name]   MRN:  [id number]   DOB:   04/30/1956     Appointment info:    Future Appointments   Date Time Provider Department Center   09/20/2020  4:30 PM [facility] COLLECTION NURSE [facility]   10/04/2020 10:40 AM Leodis Rains, APRN-NP [facility]   01/08/2021  3:00 PM Dorothyann Peng, MD [facility]   03/17/2021  3:30 PM Tommie Sams, MD [facility] [place] [facility]        Patient notified of appointment date, time and location and acknowledged understanding. Patient answered [facility] questionnaire via telephone and records were obtained accordingly.  Indication & Reason for Visit: Family hx of cancer      Referring Physician:  Dr Buckles     Previous GI testing: EGD and Colonoscopy  Location:  Outside Facility   Outside facility info: all records in O2     Additional procedures and/or surgery: No    Additional procedures?  No  If yes, where/when: none  Any abdominal or thyroid surgery?  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 Yes    Family history (1st/2nd degree relative) of 2 or more of the following GI cancers -                (Colon, stomach, small bowel, or pancreatic):   Yes  Family member had genetic testing:   No  Any family member tested positive for genetic abnormality:  No  If yes:  none    Location of records:  Testing at outside facility - records scanned into O[facility]  Films:  PACS  Comments: brother 52-colon cancer-died  sister-56 breast cancer-died  sister-48-breast cancer-died  sister -breast cancer survivor-76  brother-75-muliple myloma-fighting it  dad died 79-lung cancer  brother-polyps  son-polyps

## 2020-09-19 NOTE — Telephone Encounter
Pt reports returning pain. Last had TFESI in April 2021.   Describes pain as on both sides and different from before. Review that would advise a telehealth or in person visit. Pt states understanding and clinic visit made.

## 2020-09-20 DIAGNOSIS — Z20822 Encounter for screening laboratory testing for COVID-19 virus in asymptomatic patient: Secondary | ICD-10-CM

## 2020-09-20 NOTE — Progress Notes
Patient arrived to COVID clinic for COVID-19 testing 09/20/20 1513. Patient identity confirmed via photo I.D. Nasopharyngeal procedure explained to the patient.   Nasopharyngeal swab completed left  Patient education provided given and instructed patient self isolate until contacted w/ results and further instructions. CDC handout on COVID-19 given to patient.   https://www.cdc.gov/coronavirus/2019-ncov/downloads/10Things.pdf      Swab collected by Gracie Wright.    Reason for testing: pre op clearance.

## 2020-09-21 ENCOUNTER — Encounter: Admit: 2020-09-20 | Discharge: 2020-09-21 | Payer: BC Managed Care – PPO

## 2020-09-21 LAB — COVID-19 (SARS-COV-2) PCR

## 2020-09-23 ENCOUNTER — Encounter: Admit: 2020-09-23 | Discharge: 2020-09-23

## 2020-09-23 DIAGNOSIS — R112 Nausea with vomiting, unspecified: Secondary | ICD-10-CM

## 2020-09-23 DIAGNOSIS — E079 Disorder of thyroid, unspecified: Secondary | ICD-10-CM

## 2020-09-23 DIAGNOSIS — H269 Unspecified cataract: Secondary | ICD-10-CM

## 2020-09-23 DIAGNOSIS — R519 Generalized headaches: Secondary | ICD-10-CM

## 2020-09-23 DIAGNOSIS — M1990 Unspecified osteoarthritis, unspecified site: Secondary | ICD-10-CM

## 2020-09-23 DIAGNOSIS — K929 Disease of digestive system, unspecified: Secondary | ICD-10-CM

## 2020-09-23 MED ORDER — LIDOCAINE (PF) 100 MG/5 ML (2 %) IV SYRG
INTRAVENOUS | 0 refills | Status: DC
Start: 2020-09-23 — End: 2020-09-23

## 2020-09-23 MED ORDER — PROPOFOL 10 MG/ML IV EMUL 50 ML (INFUSION)(AM)(OR)
INTRAVENOUS | 0 refills | Status: DC
Start: 2020-09-23 — End: 2020-09-23

## 2020-09-23 NOTE — Anesthesia Post-Procedure Evaluation
Post-Anesthesia Evaluation    Name: Frank Jenkins      MRN: [id number]     DOB: 03-21-1956     Age: 64 y.o.     Sex: male   __________________________________________________________________________     Procedure Information     Anesthesia Start Date/Time: 09/23/20 1400    Procedure: ESOPHAGOGASTRODUODENOSCOPY WITH BIOPSY - FLEXIBLE (N/A )    Location: [id number]    Surgeons: Buckles, Vinnie Level, MD          Post-Anesthesia Vitals  BP: 117/80 (11/22 1440)  Temp: 36.9 C (98.4 F) (11/22 1418)  Respirations: 16 PER MINUTE (11/22 1440)  SpO2: 94 % (11/22 1440)  SpO2 Pulse: 74 (11/22 1440)  Height: 175.3 cm (69") (11/22 1323)   Vitals Value Taken Time   BP 117/80 09/23/20 1440   Temp 36.9 C (98.4 F) 09/23/20 1418   Pulse     Respirations 16 PER MINUTE 09/23/20 1440   SpO2 94 % 09/23/20 1440   ABP     ART BP           Post Anesthesia Evaluation Note    Evaluation location: pre/post  Patient participation: recovered; patient participated in evaluation  Level of consciousness: alert  Pain management: adequate    Hydration: normovolemia  Temperature: 36.0C - 38.4C  Airway patency: adequate    Perioperative Events      Postoperative Status  Cardiovascular status: hemodynamically stable  Respiratory status: spontaneous ventilation  Additional comments: Post-Anesthesia Evaluation Attestation: I reviewed and agree the indicated post-anesthesia care was provided. I have reviewed key portions of the indicated post anesthesia care. I have examined the patient's vitals, physical status, and complications and agree with what is documented.    Staff name:  Carl Best, MD Date:  09/23/2020            Perioperative Events  Perioperative Event: No  Emergency Case Activation: No

## 2020-09-24 ENCOUNTER — Encounter: Admit: 2020-09-24 | Discharge: 2020-09-24

## 2020-09-24 DIAGNOSIS — R112 Nausea with vomiting, unspecified: Secondary | ICD-10-CM

## 2020-09-24 DIAGNOSIS — R519 Generalized headaches: Secondary | ICD-10-CM

## 2020-09-24 DIAGNOSIS — K929 Disease of digestive system, unspecified: Secondary | ICD-10-CM

## 2020-09-24 DIAGNOSIS — E079 Disorder of thyroid, unspecified: Secondary | ICD-10-CM

## 2020-09-24 DIAGNOSIS — M1990 Unspecified osteoarthritis, unspecified site: Secondary | ICD-10-CM

## 2020-09-24 DIAGNOSIS — H269 Unspecified cataract: Secondary | ICD-10-CM

## 2020-10-04 ENCOUNTER — Encounter: Admit: 2020-10-04 | Discharge: 2020-10-04

## 2020-10-24 ENCOUNTER — Ambulatory Visit: Admit: 2020-10-24 | Discharge: 2020-10-24 | Payer: BC Managed Care – PPO

## 2020-10-24 ENCOUNTER — Encounter: Admit: 2020-10-24 | Discharge: 2020-10-24

## 2020-10-24 ENCOUNTER — Ambulatory Visit: Admit: 2020-10-24 | Discharge: 2020-10-24

## 2020-10-24 DIAGNOSIS — K5000 Crohn's disease of small intestine without complications: Secondary | ICD-10-CM

## 2020-10-24 DIAGNOSIS — M7918 Myalgia, other site: Secondary | ICD-10-CM

## 2020-10-24 DIAGNOSIS — M2550 Pain in unspecified joint: Secondary | ICD-10-CM

## 2020-10-24 DIAGNOSIS — H269 Unspecified cataract: Secondary | ICD-10-CM

## 2020-10-24 DIAGNOSIS — R519 Generalized headaches: Secondary | ICD-10-CM

## 2020-10-24 DIAGNOSIS — K929 Disease of digestive system, unspecified: Secondary | ICD-10-CM

## 2020-10-24 DIAGNOSIS — M5137 Other intervertebral disc degeneration, lumbosacral region: Secondary | ICD-10-CM

## 2020-10-24 DIAGNOSIS — R197 Diarrhea, unspecified: Secondary | ICD-10-CM

## 2020-10-24 DIAGNOSIS — M5412 Radiculopathy, cervical region: Secondary | ICD-10-CM

## 2020-10-24 DIAGNOSIS — M5030 Other cervical disc degeneration, unspecified cervical region: Secondary | ICD-10-CM

## 2020-10-24 DIAGNOSIS — E079 Disorder of thyroid, unspecified: Secondary | ICD-10-CM

## 2020-10-24 DIAGNOSIS — R112 Nausea with vomiting, unspecified: Secondary | ICD-10-CM

## 2020-10-24 DIAGNOSIS — M1990 Unspecified osteoarthritis, unspecified site: Secondary | ICD-10-CM

## 2020-10-24 LAB — SED RATE: Lab: 10 mm/h — ABNORMAL LOW (ref 0–20)

## 2020-10-24 LAB — 25-OH VITAMIN D (D2 + D3): Lab: 19 ng/mL — ABNORMAL LOW (ref 30–80)

## 2020-10-24 LAB — IRON + BINDING CAPACITY + %SAT+ FERRITIN
Lab: 316 ug/dL (ref 270–380)
Lab: 62 ug/dL (ref 50–185)
Lab: 74 ng/mL (ref 30–300)

## 2020-10-24 LAB — CBC AND DIFF
Lab: 0 % (ref 0–2)
Lab: 0 K/UL (ref 0–0.20)
Lab: 0.1 K/UL (ref 0–0.45)
Lab: 0.3 K/UL (ref 0–0.80)
Lab: 1.1 K/UL (ref 1.0–4.8)
Lab: 13 g/dL (ref <1.0)
Lab: 14 % (ref 11–15)
Lab: 20 % — ABNORMAL LOW (ref 24–44)
Lab: 275 K/UL (ref 150–400)
Lab: 30 pg (ref 26–34)
Lab: 4.1 K/UL (ref 1.8–7.0)
Lab: 4.5 M/UL (ref 4.4–5.5)
Lab: 42 % (ref 40–50)
Lab: 5 % (ref 4–12)
Lab: 5.8 K/UL (ref 4.5–11.0)
Lab: 7.6 FL (ref 7–11)
Lab: 72 % — ABNORMAL HIGH (ref 41–77)
Lab: 92 FL (ref 80–100)

## 2020-10-24 LAB — TSH WITH FREE T4 REFLEX: Lab: 2.9 uU/mL (ref 0.35–5.00)

## 2020-10-24 LAB — COMPREHENSIVE METABOLIC PANEL
Lab: 0.5 mg/dL (ref 0.3–1.2)
Lab: 142 MMOL/L (ref 137–147)
Lab: 4.6 MMOL/L (ref 3.5–5.1)
Lab: >60 mL/min (ref >60)

## 2020-10-24 LAB — CELIAC SCREEN

## 2020-10-24 LAB — VITAMIN B12: Lab: 299 pg/mL (ref 180–914)

## 2020-10-24 MED ORDER — CYCLOBENZAPRINE 5 MG PO TAB
5-10 mg | ORAL_TABLET | Freq: Two times a day (BID) | ORAL | 1 refills | 30.00000 days | Status: AC | PRN
Start: 2020-10-24 — End: ?

## 2020-10-24 NOTE — Progress Notes
[facility]    Subjective     Chief Complaint: multifocal pain    Interval HPI: Frank Jenkins is a 64 y.o. male who  has a past medical history of Arthritis, Cataract, Disorder of thyroid gland, Gastrointestinal disorder, Generalized headaches, Joint pain (1995), and PONV (postoperative nausea and vomiting). who now presents for evaluation.      This patient's clinical history, exam, AND imaging support radiculopathy AND there is a significant impact on quality of life and function AND their pain score has been documented in this note AND the pain has been present for at least 4 weeks AND they have failed to improve with noninvasive conservative care.       Patient presents to clinic today with neck and back pain.  Neck pain is more bothersome at this time.  Started to flare-up a few months ago. Previous CESI done on 06/15/2019 offered 80% relief lasting over one year.     Neck pain  He describes the pain as follows:  -Pain started: years ago  -It is localized to: neck pain  -Pain radiates to: bilateral posterior shoulders  -Numbness/tingling: no  -Initial inciting injury or event: yes  -Pain is constant  -The pain is described as aching, sharp and stiffness  -Alleviating factors: heat, biofreeze  -Aggravating factors: driving in the car, turning his head  -Pain rating: 4-8/10  -Loss of bowel or bladder control: No  -Experiencing weakness: subjective weakness in BUE  -+muscle stiffness/tightness       LBP  The pain is in the across the low back, L>R, will radiate in posterolateral down the LLE, left buttock and thigh  Will occasionally radiate to the right side.   Denies pain past the knees bilaterally  Pain is constant  Numbness/tingling: yes - intermittent, noted in LLE  The pain ranges 2-6/10  The pain is described as ache, sore  The pain is exacerbated by walking > 20 yards, bending, sitting    The pain is partially alleviated by position changes/sitting,  +muscle stiffness/tightness  Reports intermittent weakness in BLE, L>R    Currently taking Cymbalta 60mg  daily and reports moderate relief of pain, denies any SEs from medication use.    Hx of spine surgery: L4-L5 Discectomy - Dr. Lorel Monaco    Patient denies fevers, chills, infection, bleeding issues, anticoagulants, new muscle weakness, numbness, tingling, bowel/bladder incontinence, or saddle anesthesia.           PRIOR MEDICATIONS:   Effective  Cymbalta    Ineffective  Tylenol  Flexeril      Unable to tolerate  NSAID  Tizanidine    Never  Gabapentin  Lyrica  Ami/Nortriptyline      PRIOR INTERVENTIONS:  L-spine surgery 03/2018 at [facility]  Effective  left L4-S1 TFESI   TPI  LESI (prior to surgery)  CESI  Right L4-S1    Ineffective  Exercise, limited by pain        Domingo Mend denies any recent fevers, chills, infection, antibiotics, bowel or bladder incontinence, saddle anesthesia, bleeding issues, or recent anticoagulant.     ROS: All 14 systems reviewed and found to be negative except as above and as follows. +back pain, myalgia, joint pain, weakness.     Past Medical History:  Medical History:   Diagnosis Date   ? Arthritis    ? Cataract    ? Disorder of thyroid gland     hypothyroidectomy   ? Gastrointestinal disorder  GERD   ? Generalized headaches    ? Joint pain 1995    Neck, back, knees, shoulder   ? PONV (postoperative nausea and vomiting)        Family History:  Family History   Problem Relation Age of Onset   ? Cataract Mother    ? Hypertension Mother    ? Hyperlipidemia Mother    ? Thyroid Disease Mother    ? Cancer Father         Lung cancer   ? Cancer Sister         Breast cancer   ? Cancer Brother         Colon cancer   ? Cancer Sister         Breast cancer   ? Cancer Sister         Surviving breast cancer   ? 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   Social History     Socioeconomic History   ? Marital status: Married     Spouse name: Not on file   ? Number of children: Not on file   ? Years of education: Not on file   ? Highest education level: Not on file   Occupational History   ? Not on file   Tobacco Use   ? Smoking status: Former Smoker     Packs/day: 0.25     Years: 4.00     Pack years: 1.00     Types: Cigarettes     Quit date: 01/15/1979     Years since quitting: 41.8   ? Smokeless tobacco: Never Used   Substance and Sexual Activity   ? Alcohol use: Yes     Alcohol/week: 7.0 standard drinks     Types: 3 Cans of beer, 4 Standard drinks or equivalent per week     Comment: Socially   ? Drug use: No   ? Sexual activity: Not on file   Other Topics Concern   ? Not on file   Social History Narrative   ? Not on file       Allergies:  Allergies   Allergen Reactions   ? Albuterol RASH and EDEMA   ? Aspirin ANAPHYLAXIS and EDEMA   ? Ibuprofen ANAPHYLAXIS   ? Ketorolac ANAPHYLAXIS   ? Nsaids (Non-Steroidal Anti-Inflammatory Drug) ANAPHYLAXIS       Medications:    Current Outpatient Medications:   ?  acetaminophen (TYLENOL) 500 mg tablet, Take 500 mg by mouth every 6 hours as needed for Pain. Max of 4,000 mg of acetaminophen in 24 hours., Disp: , Rfl:   ?  cyclobenzaprine (FLEXERIL) 5 mg tablet, Take one tablet to two tablets by mouth twice daily as needed for Muscle Cramps., Disp: 90 tablet, Rfl: 1  ?  duloxetine DR (CYMBALTA) 60 mg capsule, Take 1 capsule by mouth once daily, Disp: 30 capsule, Rfl: 5  ?  ergocalciferol (vitamin D2) (VITAMIN D PO), Take  by mouth daily., Disp: , Rfl:   ?  famotidine (PEPCID) 40 mg tablet, Take one tablet by mouth at bedtime daily., Disp: 90 tablet, Rfl: 3  ?  hypromellose/dextran (ARTIFICIAL TEARS(DEXT70-HYPRO)) drop, Instill 1 drop into both eyes daily as needed, Disp: , Rfl:   ?  levothyroxine (SYNTHROID) 50 mcg tablet, Take 50 mcg by mouth daily 30 minutes  before breakfast., Disp: , Rfl:   ?  methyl salicylate/menthol (BENGAY TP), Apply  topically to affected area daily as needed., Disp: , Rfl:   ?  other medication, 1 Dose. Medication Name & Strength: Preservision    Dose(how many): 1 drop    Frequency(how often): QD, Disp: , Rfl:   ?  oxyCODONE/acetaminophen (PERCOCET) 5/325 mg tablet, Take 1 tablet by mouth every 6 hours as needed for Pain, Disp: , Rfl:   ?  pantoprazole DR (PROTONIX) 40 mg tablet, Take 40 mg by mouth every morning., Disp: , Rfl:   ?  predniSONE (DELTASONE) 20 mg tablet, Take 20 mg by mouth daily with breakfast., Disp: , Rfl:     Physical examination:   BP (!) 144/87 (BP Source: Arm, Left Upper, Patient Position: Sitting)  - Pulse 72  - Temp 36.7 ?C (98 ?F) (Temporal)  - Ht 177.8 cm (70)  - Wt 94.3 kg (208 lb) Comment: PER PT - SpO2 99%  - BMI 29.84 kg/m?   Pain Score: Five        General: The patient is a well-developed, well nourished 64 year old male whom appears in no acute distress.   HEENT: Head is normocephalic and atraumatic. EOMI bilaterally.   Cardiac: Based on palpation, pulse appears to be regular rate and rhythm.   Pulmonary: The patient has unlabored respirations and bilateral symmetric chest excursion.   Abdomen: Soft, nontender. and nondistended. There is no rebound.   Extremities: No clubbing, cyanosis, or edema.      Neurologic:   The patient is alert and oriented times 3.   fine tremor in bilateral hands, R>L     Musculoskeletal:   Gait is normal.  There is tenderness to palpation, trigger points, increased tone, and referred pain in the cervical paraspinal, bilateral levator scapular and trapezius  muscle groups.       C-Spine   There is cervical paraspinal tenderness. Paraspinal muscle tone is increased.  ROM is limited due to pain with flexion, extension, rotation, and lateral bending is intact.  Strength is equal and adequate bilaterally in the flexors and extensors of the bilateral upper extremities.   Mikey Bussing is negative bilaterally.        L-Spine   There is lumbar paraspinal tenderness. Paraspinal muscle tone is increased.  Facet loading is positive  ROM with flexion, extension, rotation, and lateral bending is preserved.  SLR is negative bilaterally       MRI C-Spine  FINDINGS:     Comparison is made with previous outside MRI of the cervical spine from   April 21, 2018.     The axial images are mildly limited by motion.     The cervical vertebral body heights and alignment are normal. There are no   aggressive or destructive geographic marrow lesions. There is no   paraspinal mass.     The visualized portions of the posterior fossa are unremarkable. The   foramen magnum is widely patent. The cervical and visualized upper   thoracic cord is normal in signal and caliber.     The level by level description is as follows:     C2-3: No spinal or foraminal stenosis     C3-4: Minimal posterior disc osteophyte complex. Asymmetric left   uncovertebral joint enlargement.. Mild left foraminal stenosis. No central   stenosis or right foraminal stenosis.     C4-5: Minimal posterior disc osteophyte complex. Asymmetric left   uncovertebral joint enlargement. Mild left foraminal stenosis.  C5-6: Unremarkable     C6-C7: Unremarkable     C7-T1: Unremarkable     IMPRESSION     1. Slightly limited exam secondary to motion.   2. Mild left neural foraminal stenosis at C3-4 and C4-5 as discussed.   3. No significant central spinal stenosis. No abnormal cord signal       MRI L-Spine    FINDINGS:     Comparison is made with prior MRIs from December 17, 2017 (preoperative)   and Mar 16, 2019 (postoperative)     There are 5 nonrib-bearing lumbar-type vertebral bodies. There are   postsurgical changes of a left L4 hemilaminectomy. These findings are   discussed further below.     There are no aggressive or destructive osseous lesions. There are several   small osseous hemangiomas. There are degenerative marrow signal changes   along several endplates.     There is no paraspinal mass.     The conus extends to the L1 level and is unremarkable.     The level by level description is as follows:     T12-L1: Unremarkable     L1-L2: Mild disc bulge. No spinal or foraminal stenosis.     L2-L3: Mild disc bulge. No spinal or foraminal stenosis.     L3-L4: Mild disc bulge asymmetrically greatest at the right foraminal/far   lateral location. Minimal lateral right foraminal stenosis (sagittal image   13 of series 3). No central spinal stenosis or left foraminal stenosis.     L4-L5: Prior left hemilaminectomy. Mild symmetric disc bulge. No   significant central stenosis. Minimal caudal bilateral foraminal stenosis.     L5-S1: Loss of disc height. There is both small central protrusion and   generalized disc bulge. The generalized bulges greater on the far lateral   location at the left. There is mild to moderate caudal far lateral left   foraminal stenosis.     IMPRESSION     1. Previous L4 left hemilaminectomy. There is a stable mild symmetric disc   bulge at L4-L5 without significant central stenosis. Minimal stable caudal   bilateral foraminal stenosis at this level.   2. L5-S1 central disc protrusion as well as asymmetric disc bulge greatest   at the far lateral location on the left. There is mild to moderate caudal   far lateral left foraminal stenosis.   3. Minimal lateral right foraminal stenosis at L3-4 secondary to   asymmetric L3-4 disc bulge.     Last Cr and LFT's:  Creatinine   Date Value Ref Range Status   02/18/2018 0.91 0.4 - 1.24 MG/DL Final          Assessment:    BENZ CHANCE is a 64 y.o. male who  has a past medical history of Arthritis, Cataract, Disorder of thyroid gland, Gastrointestinal disorder, Generalized headaches, Joint pain (1995), and PONV (postoperative nausea and vomiting). who presents for evaluation of pain.    The pain complaints are most likely due to:    1. Cervical radiculopathy  [facility] SPINE INJECT INTERLAM CRV/THRC   2. Myalgia, other site  [place] [facility] SPINE PROC TRIGGER POINT INJECTION   3. DDD (degenerative disc disease), cervical     4. DDD (degenerative disc disease), lumbosacral         Patient has had an adequate trial of > 3 months of rest, exercise, multimodal treatment, and the passage of time without improvement of symptoms. The pain has significant impact on the daily  quality of life.     Plan:    1. Plan on CESI and TPI at next available procedure appointment  2. If axial pain persists, could consider facet directed therapies to the cervical spine.  3. Future considerations repeat lumbar TFESI if back pain worsens.  4. Retrial flexeril.  Discussed risks and benefits of medication therapy and possible SEs, patient verbalized understanding and all questions answered.  5. Continue Cymbalta.  He wishes to get off this medication.  We will revisit this after CESI.   6. Follow up after injection     Risks/benefits of all pharmacologic and interventional treatments discussed and questions answered.

## 2020-11-21 ENCOUNTER — Encounter: Admit: 2020-11-21 | Discharge: 2020-11-21

## 2020-11-21 ENCOUNTER — Ambulatory Visit: Admit: 2020-11-21 | Discharge: 2020-11-21

## 2020-11-21 ENCOUNTER — Ambulatory Visit: Admit: 2020-11-21 | Discharge: 2020-11-21 | Payer: BC Managed Care – PPO

## 2020-11-21 DIAGNOSIS — M7918 Myalgia, other site: Secondary | ICD-10-CM

## 2020-11-21 DIAGNOSIS — M5412 Radiculopathy, cervical region: Secondary | ICD-10-CM

## 2020-11-21 MED ORDER — TRIAMCINOLONE ACETONIDE 40 MG/ML IJ SUSP
80 mg | Freq: Once | EPIDURAL | 0 refills | Status: CP
Start: 2020-11-21 — End: ?

## 2020-11-21 MED ORDER — LIDOCAINE (PF) 10 MG/ML (1 %) IJ SOLN
2 mL | Freq: Once | INTRAMUSCULAR | 0 refills | Status: CP
Start: 2020-11-21 — End: ?

## 2020-11-21 MED ORDER — IOHEXOL 300 MG IODINE/ML IV SOLN
2 mL | Freq: Once | 0 refills | Status: CP
Start: 2020-11-21 — End: ?

## 2020-11-21 NOTE — Discharge Instructions - Supplementary Instructions
GENERAL POST PROCEDURE INSTRUCTIONS  Physician: _________________________________  Procedure Completed Today:  Joint Injection (hip, knee, shoulder)  Cervical Epidural Steroid Injection  Cervical Transforaminal Steroid Injection  Trigger Point Injection  Caudal Epidural Steroid Injection  Pudendal Nerve Block  Other _____________________ Thoracic Epidural Steroid Injection  Lumbar Epidural Steroid Injection  Lumbar Transforaminal Steroid Injection  Facet Joint Injection  Celiac Nerve Block  Sacrococcygeal  Sacroiliac Joint Injection   Important information following your procedure today:  You may drive today     If you had sedation, you may NOT drive today  Rest at home for the next 6 hours.  You may then begin to resume your normal activities.  DO NOT drive any vehicle, operate any power tools, drink alcohol, make any major decisions, or sign any legal documents for the next 12 hours.  Pain relief may not be immediate. It is possible you may even experience an increase in pain during the first 24-48 hours followed by a gradual decrease of your pain.  Though the procedure is generally safe, and complications are rare, we do ask that you be aware of any of the following:  Any swelling, persistent redness, new bleeding or drainage from the site of the injection.  You should not experience a severe headache.  You should not run a fever over 101oF.  New onset of sharp, severe back and or neck pain.  New onset of upper or lower extremity numbness or weakness.  New difficulty controlling bowel or bladder function after injection.  New shortness of breath.  ** If any of these occur, please call to report this occurrence to a nurse at [phone]. If you are calling after 4:00 p.m. or on weekends or holidays, please call [phone] and ask to have the resident physician on call for the physician paged or go to your local emergency room.  You may experience soreness at the injection site. Ice can be applied at 20-minute intervals for the first 24 hours. The following day you may alternate ice with heat if you are experiencing muscle tightness, otherwise continue with ice. Ice works best at decreasing pain. Avoid application of direct heat, hot showers or hot tubs today.  Avoid strenuous activity today. You many resume your regular activities and exercise tomorrow.  Patients with diabetes may see an elevation in blood sugars for 7-10 days after the injection. It is important to pay close attention to your diet, check your blood sugars daily and report extreme elevations to the physician that manages your diabetes.  Patients taking daily blood thinners can resume their regular dose this evening.  It is important that you take all medications ordered by your pain physician. Taking medications as ordered is an important part of your pain care plan. If you cannot continue the medication plan, please notify the physician.    Possible side effects to steroids that may occur:  Flushing or redness of the face  Irritability  Fluid retention  Change in women's menses  Minor headache    If you are unable to keep your upcoming appointment, please notify the [facility] scheduler at [phone] at least 24 hours in advance. If you have questions for the surgery center, call [facility] at [phone].

## 2020-11-21 NOTE — Progress Notes
I have examined the patient, and there are no significant changes in their condition, from the previous H&P performed on 10/24/20.       CESI C7-T1          [facility]    Subjective     Chief Complaint: multifocal pain    Interval HPI: ZAIDE SUARES is a 65 y.o. male who  has a past medical history of Arthritis, Cataract, Disorder of thyroid gland, Gastrointestinal disorder, Generalized headaches, Joint pain (1995), and PONV (postoperative nausea and vomiting). who now presents for evaluation.      This patient's clinical history, exam, AND imaging support radiculopathy AND there is a significant impact on quality of life and function AND their pain score has been documented in this note AND the pain has been present for at least 4 weeks AND they have failed to improve with noninvasive conservative care.       Patient presents to clinic today with neck and back pain.  Neck pain is more bothersome at this time.  Started to flare-up a few months ago. Previous CESI done on 06/15/2019 offered 80% relief lasting over one year.     Neck pain  He describes the pain as follows:  -Pain started: years ago  -It is localized to: neck pain  -Pain radiates to: bilateral posterior shoulders  -Numbness/tingling: no  -Initial inciting injury or event: yes  -Pain is constant  -The pain is described as aching, sharp and stiffness  -Alleviating factors: heat, biofreeze  -Aggravating factors: driving in the car, turning his head  -Pain rating: 4-8/10  -Loss of bowel or bladder control: No  -Experiencing weakness: subjective weakness in BUE  -+muscle stiffness/tightness       LBP  The pain is in the across the low back, L>R, will radiate in posterolateral down the LLE, left buttock and thigh  Will occasionally radiate to the right side.   Denies pain past the knees bilaterally  Pain is constant  Numbness/tingling: yes - intermittent, noted in LLE  The pain ranges 2-6/10  The pain is described as ache, sore  The pain is exacerbated by walking > 20 yards, bending, sitting    The pain is partially alleviated by position changes/sitting,  +muscle stiffness/tightness  Reports intermittent weakness in BLE, L>R    Currently taking Cymbalta 60mg  daily and reports moderate relief of pain, denies any SEs from medication use.    Hx of spine surgery: L4-L5 Discectomy - Dr. Lorel Monaco    Patient denies fevers, chills, infection, bleeding issues, anticoagulants, new muscle weakness, numbness, tingling, bowel/bladder incontinence, or saddle anesthesia.           PRIOR MEDICATIONS:   Effective  Cymbalta    Ineffective  Tylenol  Flexeril      Unable to tolerate  NSAID  Tizanidine    Never  Gabapentin  Lyrica  Ami/Nortriptyline      PRIOR INTERVENTIONS:  L-spine surgery 03/2018 at [facility]  Effective  left L4-S1 TFESI   TPI  LESI (prior to surgery)  CESI  Right L4-S1    Ineffective  Exercise, limited by pain        Domingo Mend denies any recent fevers, chills, infection, antibiotics, bowel or bladder incontinence, saddle anesthesia, bleeding issues, or recent anticoagulant.     ROS: All 14 systems reviewed and found to be negative except as above and as follows. +back pain, myalgia, joint pain, weakness.     Past Medical  History:  Medical History:   Diagnosis Date   ? Arthritis    ? Cataract    ? Disorder of thyroid gland     hypothyroidectomy   ? Gastrointestinal disorder     GERD   ? Generalized headaches    ? Joint pain 1995    Neck, back, knees, shoulder   ? PONV (postoperative nausea and vomiting)        Family History:  Family History   Problem Relation Age of Onset   ? Cataract Mother    ? Hypertension Mother    ? Hyperlipidemia Mother    ? Thyroid Disease Mother    ? Cancer Father         Lung cancer   ? Cancer Sister         Breast cancer   ? Cancer Brother         Colon cancer   ? Cancer Sister         Breast cancer   ? Cancer Sister         Surviving breast cancer   ? 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   Social History     Socioeconomic History   ? Marital status: Married     Spouse name: Not on file   ? Number of children: Not on file   ? Years of education: Not on file   ? Highest education level: Not on file   Occupational History   ? Not on file   Tobacco Use   ? Smoking status: Former Smoker     Packs/day: 0.25     Years: 4.00     Pack years: 1.00     Types: Cigarettes     Quit date: 01/15/1979     Years since quitting: 41.8   ? Smokeless tobacco: Never Used   Substance and Sexual Activity   ? Alcohol use: Yes     Alcohol/week: 7.0 standard drinks     Types: 3 Cans of beer, 4 Standard drinks or equivalent per week     Comment: Socially   ? Drug use: No   ? Sexual activity: Not on file   Other Topics Concern   ? Not on file   Social History Narrative   ? Not on file       Allergies:  Allergies   Allergen Reactions   ? Albuterol RASH and EDEMA   ? Aspirin ANAPHYLAXIS and EDEMA   ? Ibuprofen ANAPHYLAXIS   ? Ketorolac ANAPHYLAXIS   ? Nsaids (Non-Steroidal Anti-Inflammatory Drug) ANAPHYLAXIS       Medications:    Current Outpatient Medications:   ?  acetaminophen (TYLENOL) 500 mg tablet, Take 500 mg by mouth every 6 hours as needed for Pain. Max of 4,000 mg of acetaminophen in 24 hours., Disp: , Rfl:   ?  cyclobenzaprine (FLEXERIL) 5 mg tablet, Take one tablet to two tablets by mouth twice daily as needed for Muscle Cramps., Disp: 90 tablet, Rfl: 1  ?  duloxetine DR (CYMBALTA) 60 mg capsule, Take 1 capsule by mouth once daily, Disp: 30 capsule, Rfl: 5  ?  ergocalciferol (vitamin D2) (VITAMIN D PO), Take  by mouth daily., Disp: , Rfl:   ?  famotidine (PEPCID) 40 mg tablet, Take one tablet by mouth at bedtime daily., Disp: 90  tablet, Rfl: 3  ?  hypromellose/dextran (ARTIFICIAL TEARS(DEXT70-HYPRO)) drop, Instill 1 drop into both eyes daily as needed, Disp: , Rfl:   ?  levothyroxine (SYNTHROID) 50 mcg tablet, Take 50 mcg by mouth daily 30 minutes before breakfast., Disp: , Rfl:   ?  methyl salicylate/menthol (BENGAY TP), Apply  topically to affected area daily as needed., Disp: , Rfl:   ?  other medication, 1 Dose. Medication Name & Strength: Preservision    Dose(how many): 1 drop    Frequency(how often): QD, Disp: , Rfl:   ?  oxyCODONE/acetaminophen (PERCOCET) 5/325 mg tablet, Take 1 tablet by mouth every 6 hours as needed for Pain, Disp: , Rfl:   ?  pantoprazole DR (PROTONIX) 40 mg tablet, Take 40 mg by mouth every morning., Disp: , Rfl:   ?  predniSONE (DELTASONE) 20 mg tablet, Take 20 mg by mouth daily with breakfast., Disp: , Rfl:     Current Facility-Administered Medications:   ?  iohexol (OMNIPAQUE-300) 300 mg/mL injection 2 mL, 2 mL, SEE ADMIN INSTRUCTIONS, ONCE, Cliffard Hair, MD  ?  lidocaine PF 1% (10 mg/mL) injection 2 mL, 2 mL, Injection, ONCE, Kately Graffam, MD  ?  triamcinolone acetonide (KENALOG-40) injection 80 mg, 80 mg, Epidural, ONCE, Rudransh Bellanca, MD    Physical examination:   BP 137/88 (BP Source: Arm, Left Upper)  - Temp 36.3 ?C (97.3 ?F)  - Wt 93.9 kg (207 lb)  - SpO2 99%  - BMI 29.70 kg/m?            General: The patient is a well-developed, well nourished 65 year old male whom appears in no acute distress.   HEENT: Head is normocephalic and atraumatic. EOMI bilaterally.   Cardiac: Based on palpation, pulse appears to be regular rate and rhythm.   Pulmonary: The patient has unlabored respirations and bilateral symmetric chest excursion.   Abdomen: Soft, nontender. and nondistended. There is no rebound.   Extremities: No clubbing, cyanosis, or edema.      Neurologic:   The patient is alert and oriented times 3.   fine tremor in bilateral hands, R>L     Musculoskeletal:   Gait is normal.  There is tenderness to palpation, trigger points, increased tone, and referred pain in the cervical paraspinal, bilateral levator scapular and trapezius  muscle groups.       C-Spine   There is cervical paraspinal tenderness. Paraspinal muscle tone is increased.  ROM is limited due to pain with flexion, extension, rotation, and lateral bending is intact.  Strength is equal and adequate bilaterally in the flexors and extensors of the bilateral upper extremities.   Mikey Bussing is negative bilaterally.        L-Spine   There is lumbar paraspinal tenderness. Paraspinal muscle tone is increased.  Facet loading is positive  ROM with flexion, extension, rotation, and lateral bending is preserved.  SLR is negative bilaterally       MRI C-Spine  FINDINGS:     Comparison is made with previous outside MRI of the cervical spine from   April 21, 2018.     The axial images are mildly limited by motion.     The cervical vertebral body heights and alignment are normal. There are no   aggressive or destructive geographic marrow lesions. There is no   paraspinal mass.     The visualized portions of the posterior fossa are unremarkable. The   foramen magnum is widely patent. The cervical and visualized upper  thoracic cord is normal in signal and caliber.     The level by level description is as follows:     C2-3: No spinal or foraminal stenosis     C3-4: Minimal posterior disc osteophyte complex. Asymmetric left   uncovertebral joint enlargement.. Mild left foraminal stenosis. No central   stenosis or right foraminal stenosis.     C4-5: Minimal posterior disc osteophyte complex. Asymmetric left   uncovertebral joint enlargement. Mild left foraminal stenosis.     C5-6: Unremarkable     C6-C7: Unremarkable     C7-T1: Unremarkable     IMPRESSION     1. Slightly limited exam secondary to motion.   2. Mild left neural foraminal stenosis at C3-4 and C4-5 as discussed.   3. No significant central spinal stenosis. No abnormal cord signal       MRI L-Spine    FINDINGS:     Comparison is made with prior MRIs from December 17, 2017 (preoperative)   and Mar 16, 2019 (postoperative)     There are 5 nonrib-bearing lumbar-type vertebral bodies. There are postsurgical changes of a left L4 hemilaminectomy. These findings are   discussed further below.     There are no aggressive or destructive osseous lesions. There are several   small osseous hemangiomas. There are degenerative marrow signal changes   along several endplates.     There is no paraspinal mass.     The conus extends to the L1 level and is unremarkable.     The level by level description is as follows:     T12-L1: Unremarkable     L1-L2: Mild disc bulge. No spinal or foraminal stenosis.     L2-L3: Mild disc bulge. No spinal or foraminal stenosis.     L3-L4: Mild disc bulge asymmetrically greatest at the right foraminal/far   lateral location. Minimal lateral right foraminal stenosis (sagittal image   13 of series 3). No central spinal stenosis or left foraminal stenosis.     L4-L5: Prior left hemilaminectomy. Mild symmetric disc bulge. No   significant central stenosis. Minimal caudal bilateral foraminal stenosis.     L5-S1: Loss of disc height. There is both small central protrusion and   generalized disc bulge. The generalized bulges greater on the far lateral   location at the left. There is mild to moderate caudal far lateral left   foraminal stenosis.     IMPRESSION     1. Previous L4 left hemilaminectomy. There is a stable mild symmetric disc   bulge at L4-L5 without significant central stenosis. Minimal stable caudal   bilateral foraminal stenosis at this level.   2. L5-S1 central disc protrusion as well as asymmetric disc bulge greatest   at the far lateral location on the left. There is mild to moderate caudal   far lateral left foraminal stenosis.   3. Minimal lateral right foraminal stenosis at L3-4 secondary to   asymmetric L3-4 disc bulge.     Last Cr and LFT's:  Creatinine   Date Value Ref Range Status   10/24/2020 1.03 0.4 - 1.24 MG/DL Final     AST (SGOT)   Date Value Ref Range Status   10/24/2020 17 7 - 40 U/L Final     ALT (SGPT)   Date Value Ref Range Status   10/24/2020 21 7 - 56 U/L Final     Alk Phosphatase   Date Value Ref Range Status   10/24/2020 80 25 - 110 U/L Final  Total Bilirubin   Date Value Ref Range Status   10/24/2020 0.5 0.3 - 1.2 MG/DL Final          Assessment:    WARWICK ROSELLO is a 65 y.o. male who  has a past medical history of Arthritis, Cataract, Disorder of thyroid gland, Gastrointestinal disorder, Generalized headaches, Joint pain (1995), and PONV (postoperative nausea and vomiting). who presents for evaluation of pain.    The pain complaints are most likely due to:    1. Cervical radiculopathy  [facility] INJECT INTERLAM CRV/THRC    [facility] SP[facility] INJECT INTERLAM CRV/THRC    lidocaine PF 1% (10 mg/mL) injection 2 mL    iohexol (OMNIPAQUE-300) 300 mg/mL injection 2 mL    triamcinolone acetonide (KENALOG-40) injection 80 mg   2. Myalgia, other site  lidocaine PF 1% (10 mg/mL) injection 2 mL    iohexol (OMNIPAQUE-300) 300 mg/mL injection 2 mL    triamcinolone acetonide (KENALOG-40) injection 80 mg       Patient has had an adequate trial of > 3 months of rest, exercise, multimodal treatment, and the passage of time without improvement of symptoms. The pain has significant impact on the daily quality of life.     Plan:    1. Plan on CESI and TPI at next available procedure appointment  2. If axial pain persists, could consider facet directed therapies to the cervical spine.  3. Future considerations repeat lumbar TFESI if back pain worsens.  4. Retrial flexeril.  Discussed risks and benefits of medication therapy and possible SEs, patient verbalized understanding and all questions answered.  5. Continue Cymbalta.  He wishes to get off this medication.  We will revisit this after CESI.   6. Follow up after injection     Risks/benefits of all pharmacologic and interventional treatments discussed and questions answered.

## 2020-11-21 NOTE — Procedures
Attending Surgeon: Evelina Bucy, MD    Anesthesia: Local    Pre-Procedure Diagnosis:   1. Cervical radiculopathy    2. Myalgia, other site        Post-Procedure Diagnosis:   1. Cervical radiculopathy    2. Myalgia, other site        [facility] CRV/THRC Injection  Procedure: epidural - interlaminar    Laterality: n/a   on 11/21/2020 12:50 PM  Location: cervical ESI with imaging -  C7-T1      Consent:   Consent obtained: written  Consent given by: patient  Risks discussed: allergic reaction, bleeding, bruising, infection, never damage, no change or worsening in pain, pneumo thorax, reaction to medication, seizure, swelling and weakness  Alternatives discussed: alternative treatment, delayed treatment and no treatment  Discussed with patient the purpose of the treatment/procedure, other ways of treating my condition, including no treatment/ procedure and the risks and benefits of the alternatives. Patient has decided to proceed with treatment/procedure.        Universal Protocol:  Relevant documents: relevant documents present and verified  Site marked: the operative site was marked  Patient identity confirmed: Patient identify confirmed verbally with patient.        Time out: Immediately prior to procedure a time out was called to verify the correct patient, procedure, equipment, support staff and site/side marked as required      Procedures Details:   Indications: pain   Prep: chlorhexidine  Patient position: prone  Estimated Blood Loss: minimal  Specimens: none  Number of Joints: 1  Approach: midline  Guidance: fluoroscopy  Contrast: Procedure confirmed with contrast under live fluoroscopy.  Needle and Epidural Catheter: tuohy  Needle size: 18 G  Injection procedure: Incremental injection and Negative aspiration for blood  Amount Injected:   C7-T1: 4mL  Patient tolerance: Patient tolerated the procedure well with no immediate complications. Pressure was applied, and hemostasis was accomplished.  Outcome: Pain unchanged  Comments:   CERVICAL INTERLAMINAR EPIDURAL STEROID INJECTION    PROCEDURE:  1) C7-T1 interlaminar epidural steroid injection  2) Fluoroscopic needle guidance    REASON FOR PROCEDURE: Cervical radiculopathy, DDD (cervical)    ATTENDING PHYSICIAN: Evelina Bucy, MD    MEDICATIONS INJECTED: 2 mL of triamciniolone (80 mg) and 2 mL of sterile, preservative-free normal saline    LOCAL ANESTHETIC INJECTED: 1 mL of 1% lidocaine    SEDATION MEDICATIONS: None    ESTIMATED BLOOD LOSS: None    SPECIMENS REMOVED: None    COMPLICATIONS: None    TECHNIQUE: Time-out was taken to identify the correct patient, procedure and side prior to starting the procedure. With the patient lying in a prone position with the neck in a slightly  flexed position, the area was prepped and draped in sterile fashion using DuraPrep and a fenestrated drape. The area was determined under fluoroscopic guidance. A 27-gauge, 1.25-inch  needle was used to anesthetize the needle entry site and subcutaneous tissues.     The 18-gauge, 3.5-inch Tuohy needle was advanced through the ligamentum flavum using loss of resistance technique. Once the tip of the needle was thought to be in the desired position, contrast was injected to confirm only epidural spread and no vascular runoff via A-P and lateral  views. The injectate was then injected slowly with intermittent negative aspiration.    The procedure was completed without complications and was tolerated well. The patient was monitored after the procedure. The patient (or responsible party) was given post-procedure and  discharge instructions to  follow at home. The patient was discharged in stable condition.    Evelina Bucy, MD, MBA          Estimated blood loss: none or minimal  Specimens: none  Patient tolerated the procedure well with no immediate complications. Pressure was applied, and hemostasis was accomplished.

## 2021-01-08 ENCOUNTER — Encounter: Admit: 2021-01-08 | Discharge: 2021-01-08

## 2021-01-21 ENCOUNTER — Encounter: Admit: 2021-01-21 | Discharge: 2021-01-21

## 2021-01-21 ENCOUNTER — Ambulatory Visit: Admit: 2021-01-21 | Discharge: 2021-01-22 | Payer: BC Managed Care – PPO

## 2021-01-21 DIAGNOSIS — M47812 Spondylosis without myelopathy or radiculopathy, cervical region: Secondary | ICD-10-CM

## 2021-01-21 DIAGNOSIS — H269 Unspecified cataract: Secondary | ICD-10-CM

## 2021-01-21 DIAGNOSIS — E079 Disorder of thyroid, unspecified: Secondary | ICD-10-CM

## 2021-01-21 DIAGNOSIS — R112 Nausea with vomiting, unspecified: Secondary | ICD-10-CM

## 2021-01-21 DIAGNOSIS — M5030 Other cervical disc degeneration, unspecified cervical region: Secondary | ICD-10-CM

## 2021-01-21 DIAGNOSIS — M47819 Spondylosis without myelopathy or radiculopathy, site unspecified: Secondary | ICD-10-CM

## 2021-01-21 DIAGNOSIS — M1990 Unspecified osteoarthritis, unspecified site: Secondary | ICD-10-CM

## 2021-01-21 DIAGNOSIS — M2550 Pain in unspecified joint: Secondary | ICD-10-CM

## 2021-01-21 DIAGNOSIS — R519 Generalized headaches: Secondary | ICD-10-CM

## 2021-01-21 DIAGNOSIS — K929 Disease of digestive system, unspecified: Secondary | ICD-10-CM

## 2021-01-21 DIAGNOSIS — M7918 Myalgia, other site: Secondary | ICD-10-CM

## 2021-01-21 NOTE — Progress Notes
[facility] Interventional Pain    Subjective     Chief Complaint: multifocal pain    Interval HPI: Frank Jenkins is a 65 y.o. male who  has a past medical history of Arthritis, Cataract, Disorder of thyroid gland, Gastrointestinal disorder, Generalized headaches, Joint pain (1995), and PONV (postoperative nausea and vomiting). who now presents for evaluation.      This patient's clinical history, exam, AND imaging support radiculopathy AND there is a significant impact on quality of life and function AND their pain score has been documented in this note AND the pain has been present for at least 4 weeks AND they have failed to improve with noninvasive conservative care.       Patient presents to clinic today with neck and back pain.  Underwent a CESI on 11/21/2020 which offered minimal relief from his pain.     Neck pain  He describes the pain as follows:  -Pain started: years ago  -It is localized to: neck pain -   -Pain radiates to: bilateral posterior shoulders - band like  -Numbness/tingling: no  -Initial inciting injury or event: yes  -Pain is constant  -The pain is described as aching, sharp and stiffness  -Alleviating factors: heat, biofreeze  -Aggravating factors: driving in the car, turning his head  -Pain rating: 4-8/10  -Loss of bowel or bladder control: No  -Experiencing weakness: subjective weakness in BUE  -+muscle stiffness/tightness       LBP  The pain is in the across the low back, L>R, will radiate in posterolateral down the LLE, left buttock and thigh  Will occasionally radiate to the right side.   Denies pain past the knees bilaterally  Pain is constant  Numbness/tingling: yes - intermittent, noted in LLE  The pain ranges 2-6/10  The pain is described as ache, sore  The pain is exacerbated by walking > 20 yards, bending, sitting    The pain is partially alleviated by position changes/sitting,  +muscle stiffness/tightness  Reports intermittent weakness in BLE, L>R    Currently taking Cymbalta 60mg  daily and Flexeril at night reports moderate relief of pain, denies any SEs from medication use.    Hx of spine surgery: L4-L5 Discectomy - Dr. Lorel Monaco    Patient denies fevers, chills, infection, bleeding issues, anticoagulants, new muscle weakness, numbness, tingling, bowel/bladder incontinence, or saddle anesthesia.           PRIOR MEDICATIONS:   Effective  Cymbalta  Flexeril    Ineffective  Tylenol        Unable to tolerate  NSAID - severe allergy  Tizanidine    Never  Gabapentin  Lyrica  Ami/Nortriptyline      PRIOR INTERVENTIONS:  L-spine surgery 03/2018 at [facility]  Effective  left L4-S1 TFESI   TPI  LESI (prior to surgery)  CESI  Right L4-S1    Ineffective  Exercise, limited by pain  Recent CESI - minimal relief      Frank Jenkins denies any recent fevers, chills, infection, antibiotics, bowel or bladder incontinence, saddle anesthesia, bleeding issues, or recent anticoagulant.     ROS: All 14 systems reviewed and found to be negative except as above and as follows. +back pain, myalgia, joint pain, weakness.     Past Medical History:  Medical History:   Diagnosis Date   ? Arthritis    ? Cataract    ? Disorder of thyroid gland     hypothyroidectomy   ? Gastrointestinal disorder  GERD   ? Generalized headaches    ? Joint pain 1995    Neck, back, knees, shoulder   ? PONV (postoperative nausea and vomiting)        Family History:  Family History   Problem Relation Age of Onset   ? Cataract Mother    ? Hypertension Mother    ? Hyperlipidemia Mother    ? Thyroid Disease Mother    ? Cancer Father         Lung cancer   ? Cancer Sister         Breast cancer   ? Cancer Brother         Colon cancer   ? Cancer Sister         Breast cancer   ? Cancer Sister         Surviving breast cancer   ? 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   Social History     Socioeconomic History ? Marital status: Married     Spouse name: Not on file   ? Number of children: Not on file   ? Years of education: Not on file   ? Highest education level: Not on file   Occupational History   ? Not on file   Tobacco Use   ? Smoking status: Former Smoker     Packs/day: 0.25     Years: 4.00     Pack years: 1.00     Types: Cigarettes     Quit date: 01/15/1979     Years since quitting: 42.0   ? Smokeless tobacco: Never Used   Substance and Sexual Activity   ? Alcohol use: Yes     Alcohol/week: 7.0 standard drinks     Types: 3 Cans of beer, 4 Standard drinks or equivalent per week     Comment: Socially   ? Drug use: No   ? Sexual activity: Not on file   Other Topics Concern   ? Not on file   Social History Narrative   ? Not on file       Allergies:  Allergies   Allergen Reactions   ? Albuterol RASH and EDEMA   ? Aspirin ANAPHYLAXIS and EDEMA   ? Ibuprofen ANAPHYLAXIS   ? Ketorolac ANAPHYLAXIS   ? Nsaids (Non-Steroidal Anti-Inflammatory Drug) ANAPHYLAXIS       Medications:    Current Outpatient Medications:   ?  acetaminophen (TYLENOL) 500 mg tablet, Take 500 mg by mouth every 6 hours as needed for Pain. Max of 4,000 mg of acetaminophen in 24 hours., Disp: , Rfl:   ?  cyclobenzaprine (FLEXERIL) 5 mg tablet, Take one tablet to two tablets by mouth twice daily as needed for Muscle Cramps., Disp: 90 tablet, Rfl: 1  ?  duloxetine DR (CYMBALTA) 60 mg capsule, Take 1 capsule by mouth once daily, Disp: 30 capsule, Rfl: 5  ?  ergocalciferol (vitamin D2) (VITAMIN D PO), Take  by mouth daily., Disp: , Rfl:   ?  famotidine (PEPCID) 40 mg tablet, Take one tablet by mouth at bedtime daily., Disp: 90 tablet, Rfl: 3  ?  hypromellose/dextran (ARTIFICIAL TEARS(DEXT70-HYPRO)) drop, Instill 1 drop into both eyes daily as needed, Disp: , Rfl:   ?  levothyroxine (SYNTHROID) 50 mcg tablet, Take 50 mcg by mouth daily 30 minutes before breakfast.,  Disp: , Rfl:   ?  methyl salicylate/menthol (BENGAY TP), Apply  topically to affected area daily as needed., Disp: , Rfl:   ?  other medication, 1 Dose. Medication Name & Strength: Preservision    Dose(how many): 1 drop    Frequency(how often): QD, Disp: , Rfl:   ?  oxyCODONE/acetaminophen (PERCOCET) 5/325 mg tablet, Take 1 tablet by mouth every 6 hours as needed for Pain, Disp: , Rfl:   ?  pantoprazole DR (PROTONIX) 40 mg tablet, Take 40 mg by mouth every morning., Disp: , Rfl:   ?  predniSONE (DELTASONE) 20 mg tablet, Take 20 mg by mouth daily with breakfast., Disp: , Rfl:     Physical examination:   Pulse 85  - Ht 177.8 cm (5' 10)  - Wt 93.9 kg (207 lb)  - SpO2 97%  - BMI 29.70 kg/m?   Pain Score: Five        General: The patient is a well-developed, well nourished 65 year old male whom appears in no acute distress.   HEENT: Head is normocephalic and atraumatic. EOMI bilaterally.   Cardiac: Based on palpation, pulse appears to be regular rate and rhythm.   Pulmonary: The patient has unlabored respirations and bilateral symmetric chest excursion.   Abdomen: Soft, nontender. and nondistended. There is no rebound.   Extremities: No clubbing, cyanosis, or edema.      Neurologic:   The patient is alert and oriented times 3.        Musculoskeletal:   Gait is normal.  There is tenderness to palpation, trigger points, increased tone, and referred pain in the cervical paraspinal, bilateral levator scapular and trapezius  muscle groups.       C-Spine   There is cervical paraspinal tenderness. Paraspinal muscle tone is increased.  ROM is limited due to pain with flexion, extension, rotation, and lateral bending is intact.  Strength is equal and adequate bilaterally in the flexors and extensors of the bilateral upper extremities.   Mikey Bussing is negative bilaterally.        L-Spine   There is lumbar paraspinal tenderness. Paraspinal muscle tone is increased.  Facet loading is positive  ROM with flexion, extension, rotation, and lateral bending is preserved.  SLR is negative bilaterally       MRI C-Spine  FINDINGS: Comparison is made with previous outside MRI of the cervical spine from   April 21, 2018.     The axial images are mildly limited by motion.     The cervical vertebral body heights and alignment are normal. There are no   aggressive or destructive geographic marrow lesions. There is no   paraspinal mass.     The visualized portions of the posterior fossa are unremarkable. The   foramen magnum is widely patent. The cervical and visualized upper   thoracic cord is normal in signal and caliber.     The level by level description is as follows:     C2-3: No spinal or foraminal stenosis     C3-4: Minimal posterior disc osteophyte complex. Asymmetric left   uncovertebral joint enlargement.. Mild left foraminal stenosis. No central   stenosis or right foraminal stenosis.     C4-5: Minimal posterior disc osteophyte complex. Asymmetric left   uncovertebral joint enlargement. Mild left foraminal stenosis.     C5-6: Unremarkable     C6-C7: Unremarkable     C7-T1: Unremarkable     IMPRESSION     1. Slightly limited exam secondary to  motion.   2. Mild left neural foraminal stenosis at C3-4 and C4-5 as discussed.   3. No significant central spinal stenosis. No abnormal cord signal       MRI L-Spine    FINDINGS:     Comparison is made with prior MRIs from December 17, 2017 (preoperative)   and Mar 16, 2019 (postoperative)     There are 5 nonrib-bearing lumbar-type vertebral bodies. There are   postsurgical changes of a left L4 hemilaminectomy. These findings are   discussed further below.     There are no aggressive or destructive osseous lesions. There are several   small osseous hemangiomas. There are degenerative marrow signal changes   along several endplates.     There is no paraspinal mass.     The conus extends to the L1 level and is unremarkable.     The level by level description is as follows:     T12-L1: Unremarkable     L1-L2: Mild disc bulge. No spinal or foraminal stenosis.     L2-L3: Mild disc bulge. No spinal or foraminal stenosis.     L3-L4: Mild disc bulge asymmetrically greatest at the right foraminal/far   lateral location. Minimal lateral right foraminal stenosis (sagittal image   13 of series 3). No central spinal stenosis or left foraminal stenosis.     L4-L5: Prior left hemilaminectomy. Mild symmetric disc bulge. No   significant central stenosis. Minimal caudal bilateral foraminal stenosis.     L5-S1: Loss of disc height. There is both small central protrusion and   generalized disc bulge. The generalized bulges greater on the far lateral   location at the left. There is mild to moderate caudal far lateral left   foraminal stenosis.     IMPRESSION     1. Previous L4 left hemilaminectomy. There is a stable mild symmetric disc   bulge at L4-L5 without significant central stenosis. Minimal stable caudal   bilateral foraminal stenosis at this level.   2. L5-S1 central disc protrusion as well as asymmetric disc bulge greatest   at the far lateral location on the left. There is mild to moderate caudal   far lateral left foraminal stenosis.   3. Minimal lateral right foraminal stenosis at L3-4 secondary to   asymmetric L3-4 disc bulge.     Last Cr and LFT's:  Creatinine   Date Value Ref Range Status   10/24/2020 1.03 0.4 - 1.24 MG/DL Final     AST (SGOT)   Date Value Ref Range Status   10/24/2020 17 7 - 40 U/L Final     ALT (SGPT)   Date Value Ref Range Status   10/24/2020 21 7 - 56 U/L Final     Alk Phosphatase   Date Value Ref Range Status   10/24/2020 80 25 - 110 U/L Final     Total Bilirubin   Date Value Ref Range Status   10/24/2020 0.5 0.3 - 1.2 MG/DL Final          Assessment:    Frank Jenkins is a 65 y.o. male who  has a past medical history of Arthritis, Cataract, Disorder of thyroid gland, Gastrointestinal disorder, Generalized headaches, Joint pain (1995), and PONV (postoperative nausea and vomiting). who presents for evaluation of pain.    The pain complaints are most likely due to:    1. Spondylosis of cervical region without myelopathy or radiculopathy  [place] AM[facility] SPINE INJECT MBB CERV/THOR   2. Facet arthropathy  3. DDD (degenerative disc disease), cervical     4. Myofascial pain         Patient has had an adequate trial of > 12 months of rest, exercise, multimodal treatment, and the passage of time without improvement of symptoms. The pain has significant impact on the daily quality of life.     Plan:    1.Discussed care options with patient. Plan on bilateral C6-T1 CESI at next available appointment, for consideration of RFA.   2.Continue cymbalta and flexeril as prescribed. He wishes to taper off these medications, but will revisit after RFA.   3. Encouraged to continue at home PT program.  4. Future considerations, repeat Lumbar TFESI for back pain  5. Follow up after RFA or sooner if needed.     Risks/benefits of all pharmacologic and interventional treatments discussed and questions answered.

## 2021-01-22 ENCOUNTER — Encounter: Admit: 2021-01-22 | Discharge: 2021-01-22

## 2021-01-22 NOTE — Telephone Encounter
Kara Mead with [facility] calling requesting additional information in regards to patients upcoming procedure, requesting phone call back Ref# [id number]    Spoke with [employer] Rep. Stated that Kara Mead is requesting CPT Codes for Visit and what the procedure is. Provided information for MBB's and CPT codes for procedure date of 13-Feb-2021.     No further questions at this time

## 2021-01-27 ENCOUNTER — Encounter: Admit: 2021-01-27 | Discharge: 2021-01-27

## 2021-01-27 ENCOUNTER — Ambulatory Visit: Admit: 2021-01-27 | Discharge: 2021-01-27

## 2021-01-27 DIAGNOSIS — R509 Fever, unspecified: Secondary | ICD-10-CM

## 2021-02-10 ENCOUNTER — Encounter: Admit: 2021-02-10 | Discharge: 2021-02-10

## 2021-02-11 ENCOUNTER — Encounter: Admit: 2021-02-11 | Discharge: 2021-02-11

## 2021-02-16 ENCOUNTER — Encounter: Admit: 2021-02-16 | Discharge: 2021-02-16

## 2021-03-17 ENCOUNTER — Encounter: Admit: 2021-03-17 | Discharge: 2021-03-17 | Payer: BC Managed Care – PPO

## 2021-03-17 ENCOUNTER — Encounter: Admit: 2021-03-17 | Discharge: 2021-03-17

## 2021-03-17 DIAGNOSIS — H269 Unspecified cataract: Secondary | ICD-10-CM

## 2021-03-17 DIAGNOSIS — K635 Polyp of colon: Secondary | ICD-10-CM

## 2021-03-17 DIAGNOSIS — R112 Nausea with vomiting, unspecified: Secondary | ICD-10-CM

## 2021-03-17 DIAGNOSIS — K929 Disease of digestive system, unspecified: Secondary | ICD-10-CM

## 2021-03-17 DIAGNOSIS — E079 Disorder of thyroid, unspecified: Secondary | ICD-10-CM

## 2021-03-17 DIAGNOSIS — M1990 Unspecified osteoarthritis, unspecified site: Secondary | ICD-10-CM

## 2021-03-17 DIAGNOSIS — M2550 Pain in unspecified joint: Secondary | ICD-10-CM

## 2021-03-17 DIAGNOSIS — R519 Generalized headaches: Secondary | ICD-10-CM

## 2021-03-17 NOTE — Patient Instructions
Referral to complete genetic testing    Please call for any questions. Maralyn Sago, RN [phone].

## 2021-03-18 ENCOUNTER — Encounter: Admit: 2021-03-18 | Discharge: 2021-03-18

## 2021-04-03 ENCOUNTER — Encounter: Admit: 2021-04-03 | Discharge: 2021-04-03

## 2021-08-15 ENCOUNTER — Encounter: Admit: 2021-08-15 | Discharge: 2021-08-15

## 2021-08-15 DIAGNOSIS — K219 Gastro-esophageal reflux disease without esophagitis: Secondary | ICD-10-CM

## 2021-08-15 MED ORDER — FAMOTIDINE 40 MG PO TAB
ORAL_TABLET | Freq: Every day | 0 refills
Start: 2021-08-15 — End: ?

## 2021-08-18 ENCOUNTER — Encounter: Admit: 2021-08-18 | Discharge: 2021-08-18

## 2021-08-18 MED ORDER — PANTOPRAZOLE 40 MG PO TBEC
40 mg | ORAL_TABLET | Freq: Every morning | ORAL | 0 refills | 90.00000 days | Status: AC
Start: 2021-08-18 — End: ?

## 2021-08-19 ENCOUNTER — Encounter: Admit: 2021-08-19 | Discharge: 2021-08-19

## 2021-08-19 NOTE — Telephone Encounter
Frank Jenkins (Frank Jenkins) - [phone]  Pantoprazole Sodium 40MG  dr tablets       Status: PA Response - Approved    Created: October 17th, 2022 October 19th, 2022    Sent: October 18th, 2022

## 2021-08-19 NOTE — Telephone Encounter
Request [id number]  Form: Ambulance person PA Form [phone] NCPDP)  Drug: Pantoprazole Sodium 40MG  dr tablets  With Clinicals

## 2021-09-01 ENCOUNTER — Encounter: Admit: 2021-09-01 | Discharge: 2021-09-01

## 2021-09-01 ENCOUNTER — Ambulatory Visit: Admit: 2021-09-01 | Discharge: 2021-09-02 | Payer: BC Managed Care – PPO

## 2021-09-01 DIAGNOSIS — K449 Diaphragmatic hernia without obstruction or gangrene: Secondary | ICD-10-CM

## 2021-09-01 DIAGNOSIS — K929 Disease of digestive system, unspecified: Secondary | ICD-10-CM

## 2021-09-01 DIAGNOSIS — H269 Unspecified cataract: Secondary | ICD-10-CM

## 2021-09-01 DIAGNOSIS — Z86010 Personal history of colonic polyps: Secondary | ICD-10-CM

## 2021-09-01 DIAGNOSIS — Z800 Family history of malignant neoplasm of digestive organs: Secondary | ICD-10-CM

## 2021-09-01 DIAGNOSIS — K5000 Crohn's disease of small intestine without complications: Secondary | ICD-10-CM

## 2021-09-01 DIAGNOSIS — R112 Nausea with vomiting, unspecified: Secondary | ICD-10-CM

## 2021-09-01 DIAGNOSIS — M1990 Unspecified osteoarthritis, unspecified site: Secondary | ICD-10-CM

## 2021-09-01 DIAGNOSIS — H3530 Unspecified macular degeneration: Secondary | ICD-10-CM

## 2021-09-01 DIAGNOSIS — M2550 Pain in unspecified joint: Secondary | ICD-10-CM

## 2021-09-01 DIAGNOSIS — E079 Disorder of thyroid, unspecified: Secondary | ICD-10-CM

## 2021-09-01 DIAGNOSIS — R519 Generalized headaches: Secondary | ICD-10-CM

## 2021-09-01 DIAGNOSIS — K219 Gastro-esophageal reflux disease without esophagitis: Secondary | ICD-10-CM

## 2021-09-01 DIAGNOSIS — H5440 Blindness, one eye, unspecified eye: Secondary | ICD-10-CM

## 2021-09-01 MED ORDER — FAMOTIDINE 40 MG PO TAB
40 mg | ORAL_TABLET | Freq: Every evening | ORAL | 3 refills | 90.00000 days | Status: AC
Start: 2021-09-01 — End: ?

## 2021-09-01 MED ORDER — PANTOPRAZOLE 40 MG PO TBEC
40 mg | ORAL_TABLET | Freq: Every morning | ORAL | 3 refills | 90.00000 days | Status: AC
Start: 2021-09-01 — End: ?

## 2021-09-01 NOTE — Patient Instructions
Refill Pantoprazole and Famotidine   Colonoscopy in 2023  Follow up with Genetics Counselor if you wish to pursue genetic testing    If you have questions or concerns that need to be addressed before your next scheduled office visit, please call my nurse at [phone] or send me a MyChart patient message.

## 2021-09-01 NOTE — Progress Notes
Telehealth Visit Note    Date of Service: 09/01/2021    Subjective:      Obtained patient's verbal consent to treat them and their agreement to [facility] financial policy and NPP via this telehealth visit during the [facility]       Frank Jenkins is a 65 y.o. male.    History of Present Illness    I had a follow-up appointment with Frank Jenkins.  I initially met him last fall when he wished to transfer his GI care to [facility].  I reviewed his previous upper and lower endoscopic findings at that time.  Patient has a family history of colon cancer and personal history of colon polyps.  His last colonoscopy was in 2020.  He had adenomatous polyps and 3-year surveillance was recommended at that time.  Patient also has a strong family history of colon cancer including a brother at age 63.  After our last visit I referred the patient for upper endoscopy and added Pepcid 40 mg at bedtime for reflux and referred the patient to high risk GI genetics clinic.  The patient did see the high risk GI cancer prevention clinic, and was referred to genetics counselor to consider genetic testing.  However, the patient now is not interested in pursuing genetic testing.  EGD was performed September 23, 2020.  This showed normal esophagus and a small 1 cm hiatal hernia and normal stomach.  Duodenum was normal and biopsies showed gastric heterotopia and no evidence of celiac disease.  Labs from December 2021 show hemoglobin of 13.9, platelets 275, white count 5.8, sed rate of 10, CRP of 0.63, vitamin B12 of 299, iron of 62, percent saturation 20, TIBC of 316, and ferritin of 74.  The patient has some persistent aphthous ulcerations in his terminal ileum and is thought to have mild indolent Crohn's disease.  He is currently not on therapy.  He is not symptomatic at this point time denies diarrhea or blood in stool or abdominal pain.  He has never had a resection or abscess or bowel obstruction.  There had been discussion about placing the patient on anti-Crohn's medication in the past, but the patient had opted against it.       Review of Systems   Constitutional: Positive for fatigue (just a little bit more than usual.).   HENT: Negative.    Eyes: Positive for visual disturbance.   Respiratory: Negative.    Cardiovascular: Negative.    Gastrointestinal: Negative.    Endocrine: Negative.    Genitourinary: Negative.    Musculoskeletal: Positive for back pain (always.), myalgias (I am getting older.), neck pain (sometimes, I need my nerves burned because of a herniated disc.) and neck stiffness.   Skin: Negative.    Allergic/Immunologic: Negative.    Neurological: Positive for dizziness, light-headedness and headaches.   Hematological:        skin is a littler thinner but thats the only thing I would say would make me bruise easier.     Psychiatric/Behavioral: Positive for sleep disturbance.   A complete review of systems was obtained from the patient and all other systems were reviewed and negative.    Objective:         ? acetaminophen (TYLENOL) 500 mg tablet Take 500 mg by mouth every 6 hours as needed for Pain. Max of 4,000 mg of acetaminophen in 24 hours.   ? cyclobenzaprine (FLEXERIL) 5 mg tablet Take one tablet to two tablets by  mouth twice daily as needed for Muscle Cramps.   ? duloxetine DR (CYMBALTA) 60 mg capsule Take 1 capsule by mouth once daily   ? ergocalciferol (vitamin D2) (VITAMIN D PO) Take  by mouth daily.   ? famotidine (PEPCID) 40 mg tablet TAKE 1 TABLET BY MOUTH EVERY DAY AT BEDTIME   ? hypromellose/dextran (OCUCOAT) drop Instill 1 drop into both eyes daily as needed   ? levothyroxine (SYNTHROID) 50 mcg tablet Take 50 mcg by mouth daily 30 minutes before breakfast.   ? methyl salicylate/menthol (BENGAY TP) Apply  topically to affected area daily as needed.   ? other medication 1 Dose. Medication Name & Strength: Preservision    Dose(how many): 1 drop    Frequency(how often): QD   ? pantoprazole DR (PROTONIX) 40 mg tablet Take one tablet by mouth every morning. Indications: gastroesophageal reflux disease      Telehealth Patient Reported Vitals     Row Name 09/01/21 1421                Weight: 95.3 kg (210 lb)  per pt        Height: 176.5 cm (5' 9.5)  per pt        Pain Score: SIX        Pain Location: HEAD                  Telehealth Body Mass Index: 30.57 at 09/01/2021  2:25 PM    Physical Exam  Vital signs were reviewed  Patient is not in distress  Complete physical exam was not performed due to telehealth visit only        Assessment and Plan:          [name] was seen today for follow up.    Diagnoses and all orders for this visit:    Gastroesophageal reflux disease without esophagitis  -     famotidine (PEPCID) 40 mg tablet; Take one tablet by mouth at bedtime daily.  -     pantoprazole DR (PROTONIX) 40 mg tablet; Take one tablet by mouth every morning. Indications: gastroesophageal reflux disease    Crohn's disease of small intestine without complication (HCC)    Hiatal hernia    Family history of colon cancer    Hx of colonic polyps    Gastroesophageal reflux disease, unspecified whether esophagitis present    [name] has nonerosive reflux.  EGD in November 2021 did not show any worrisome findings.  Patient has a small hiatal hernia.  He has done better with the addition of famotidine 40 mg at bedtime.  I refilled both his daily pantoprazole and nighttime famotidine for 1 year.  The patient most likely has indolent Crohn's disease.  He is currently asymptomatic.  Changes have been noted in his terminal ileum on his prior colonoscopies for many years.  I do not think the benefits of treatment of his Crohn's outweigh the risks at this point in time.  We will continue observation for now.  Recent labs did not show any evidence of anemia or low iron studies or low vitamin B12 level or systemic inflammation.  The patient will be due for surveillance colonoscopy next year.  I encouraged the patient to follow-up with our genetics counselor if he wishes to pursue genetic testing regarding his family history of colorectal cancer.  I will schedule the patient to follow-up with me next spring and we will likely schedule his colonoscopy at that time.  Patient Instructions   1. Refill Pantoprazole and Famotidine   2. Colonoscopy in 2023  3. Follow up with Genetics Counselor if you wish to pursue genetic testing    If you have questions or concerns that need to be addressed before your next scheduled office visit, please call my nurse at [phone] or send me a MyChart patient message.    A total of 30 minutes were spent today on this encounter.  This includes preparing for the visit, reviewing medical records, obtaining relevant history, speaking to the patient, interpreting test results, ordering medications and tests and documenting the visit in the electronic health record.

## 2022-02-27 ENCOUNTER — Ambulatory Visit: Admit: 2022-02-27 | Discharge: 2022-02-27 | Payer: BC Managed Care – PPO

## 2022-02-27 ENCOUNTER — Encounter: Admit: 2022-02-27 | Discharge: 2022-02-27

## 2022-02-27 DIAGNOSIS — K929 Disease of digestive system, unspecified: Secondary | ICD-10-CM

## 2022-02-27 DIAGNOSIS — M5030 Other cervical disc degeneration, unspecified cervical region: Secondary | ICD-10-CM

## 2022-02-27 DIAGNOSIS — R519 Generalized headaches: Secondary | ICD-10-CM

## 2022-02-27 DIAGNOSIS — H269 Unspecified cataract: Secondary | ICD-10-CM

## 2022-02-27 DIAGNOSIS — M2550 Pain in unspecified joint: Secondary | ICD-10-CM

## 2022-02-27 DIAGNOSIS — H5440 Blindness, one eye, unspecified eye: Secondary | ICD-10-CM

## 2022-02-27 DIAGNOSIS — M5417 Radiculopathy, lumbosacral region: Secondary | ICD-10-CM

## 2022-02-27 DIAGNOSIS — M5412 Radiculopathy, cervical region: Secondary | ICD-10-CM

## 2022-02-27 DIAGNOSIS — R112 Nausea with vomiting, unspecified: Secondary | ICD-10-CM

## 2022-02-27 DIAGNOSIS — M47819 Spondylosis without myelopathy or radiculopathy, site unspecified: Secondary | ICD-10-CM

## 2022-02-27 DIAGNOSIS — H3530 Unspecified macular degeneration: Secondary | ICD-10-CM

## 2022-02-27 DIAGNOSIS — E079 Disorder of thyroid, unspecified: Secondary | ICD-10-CM

## 2022-02-27 DIAGNOSIS — M47812 Spondylosis without myelopathy or radiculopathy, cervical region: Secondary | ICD-10-CM

## 2022-02-27 DIAGNOSIS — M1990 Unspecified osteoarthritis, unspecified site: Secondary | ICD-10-CM

## 2022-02-27 DIAGNOSIS — M7918 Myalgia, other site: Secondary | ICD-10-CM

## 2022-02-27 NOTE — Progress Notes
[facility] Interventional [facility]    Subjective     Chief Complaint: multifocal pain    Interval HPI: Frank Jenkins is a 66 y.o. male who  has a past medical history of Arthritis, Blind painful right eye, Cataract, Disorder of thyroid gland, Gastrointestinal disorder, Generalized headaches, Joint pain (1995), Macular degeneration, and PONV (postoperative nausea and vomiting). who now presents for evaluation.      Patient presents to clinic today with neck and back pain.  Underwent a CESI on 11/21/2020 which offered minimal relief from his pain. Primary pain concern today is his neck.    Neck pain  He describes the pain as follows:  -pain started >20 years ago  -it is localized to neck  Axial pain only  No UE radicular pain  -numbness/tingling: none  -initial inciting injury or event: in 1995 had 400lb of flour fall on his back during work  -pain is constant  -the pain is described as tight  -alleviating factors: flexeril  -aggravating factors: rotation of neck to the right  -the pain radiates: Yes to trapezius  -VAS 5/10  -loss of bowel or bladder control: No  -unexplained weight loss: No      LBP  He notes pain in the low back and radiating down the LLE in the posterolateral distribution.  It started after moving a patient.   He has had LBP for >20 years, this is an exacerbation.     It is painful when he bends or lifts.     +tightness.     He does not feel the Flexeril is that helpful.     He weaned off the Cymbalta.     Hx of spine surgery: L4-L5 Discectomy - Dr. Lorel Monaco    Patient denies fevers, chills, infection, bleeding issues, anticoagulants, new muscle weakness, numbness, tingling, bowel/bladder incontinence, or saddle anesthesia.           PRIOR MEDICATIONS:   Effective      Ineffective  Tylenol  Cymbalta  Flexeril    Unable to tolerate  NSAID - severe allergy  Tizanidine    Never  Gabapentin  Lyrica  Ami/Nortriptyline  Methocarbamol    PRIOR INTERVENTIONS:  L-spine surgery 03/2018 at [place]  Effective  left L4-S1 TFESI   TPI  LESI (prior to surgery)  CESI  Right L4-S1    Ineffective  Exercise, limited by pain  Recent CESI - minimal relief      Frank Jenkins denies any recent fevers, chills, infection, antibiotics, bowel or bladder incontinence, saddle anesthesia, bleeding issues, or recent anticoagulant.     Past Medical History:  Medical History:   Diagnosis Date   ? Arthritis    ? Blind painful right eye    ? Cataract    ? Disorder of thyroid gland     hypothyroidectomy   ? Gastrointestinal disorder     GERD   ? Generalized headaches    ? Joint pain 1995    Neck, back, knees, shoulder   ? Macular degeneration     left eye   ? PONV (postoperative nausea and vomiting)        Family History:  Family History   Problem Relation Age of Onset   ? Cataract Mother    ? Hypertension Mother    ? Hyperlipidemia Mother    ? Thyroid Disease Mother    ? Cancer Father         Lung cancer   ? Cancer Sister  Breast cancer   ? Cancer Brother         Colon cancer   ? Cancer Sister         Breast cancer   ? Cancer Sister         Surviving breast cancer   ? 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   Social History     Socioeconomic History   ? Marital status: Married   Tobacco Use   ? Smoking status: Former     Packs/day: 0.25     Years: 4.00     Pack years: 1.00     Types: Cigarettes     Quit date: 01/15/1979     Years since quitting: 43.1   ? Smokeless tobacco: Never   Substance and Sexual Activity   ? Alcohol use: Yes     Alcohol/week: 1.0 - 2.0 standard drink     Types: 1 - 2 Cans of beer per week     Comment: Socially   ? Drug use: No   ? Sexual activity: Yes     Partners: Female     Birth control/protection: Post-menopausal       Allergies:  Allergies   Allergen Reactions   ? Albuterol RASH and EDEMA   ? Aspirin ANAPHYLAXIS and EDEMA   ? Ibuprofen ANAPHYLAXIS   ? Ketorolac ANAPHYLAXIS   ? Nsaids (Non-Steroidal Anti-Inflammatory Drug) ANAPHYLAXIS       Medications:    Current Outpatient Medications:   ?  acetaminophen (TYLENOL) 500 mg tablet, Take one tablet by mouth every 6 hours as needed for Pain. Max of 4,000 mg of acetaminophen in 24 hours., Disp: , Rfl:   ?  cyclobenzaprine (FLEXERIL) 5 mg tablet, Take one tablet to two tablets by mouth twice daily as needed for Muscle Cramps., Disp: 90 tablet, Rfl: 1  ?  duloxetine DR (CYMBALTA) 60 mg capsule, Take 1 capsule by mouth once daily, Disp: 30 capsule, Rfl: 5  ?  ergocalciferol (vitamin D2) (VITAMIN D PO), Take  by mouth daily., Disp: , Rfl:   ?  famotidine (PEPCID) 40 mg tablet, Take one tablet by mouth at bedtime daily., Disp: 90 tablet, Rfl: 3  ?  hypromellose/dextran (OCUCOAT) drop, Instill 1 drop into both eyes daily as needed, Disp: , Rfl:   ?  levothyroxine (SYNTHROID) 50 mcg tablet, Take one tablet by mouth daily 30 minutes before breakfast., Disp: , Rfl:   ?  methyl salicylate/menthol (BENGAY TP), Apply  topically to affected area daily as needed., Disp: , Rfl:   ?  other medication, one Dose. Medication Name & Strength: Preservision    Dose(how many): 1 drop    Frequency(how often): QD, Disp: , Rfl:   ?  pantoprazole DR (PROTONIX) 40 mg tablet, Take one tablet by mouth every morning. Indications: gastroesophageal reflux disease, Disp: 90 tablet, Rfl: 3    Physical examination:   BP 130/85 (BP Source: Arm, Right Upper, Patient Position: Sitting)  - Pulse 83  - Ht 175.3 cm (5' 9)  - Wt 95.3 kg (210 lb)  - SpO2 97%  - BMI 31.01 kg/m?   Pain Score: Seven        General: The patient is a well-developed, well nourished 66 year old male whom appears in no acute distress.   HEENT: Head is normocephalic and  atraumatic.   Cardiac: Based on palpation, pulse appears to be regular rate and rhythm.   Pulmonary: The patient has unlabored respirations and bilateral symmetric chest excursion.   Abdomen: Soft, nontender. and nondistended.   Extremities: No clubbing, cyanosis, or edema.      Neurologic:   The patient is alert and oriented times 3.        Musculoskeletal:   There is tenderness to palpation, trigger points, increased tone, and referred pain in the cervical paraspinal, bilateral levator scapular and trapezius  muscle groups.       C-Spine   There is cervical paraspinal tenderness. Paraspinal muscle tone is increased.  +facet loading  ROM is limited due to pain with flexion, extension, rotation, and lateral bending is intact.  Strength is equal and adequate bilaterally in the flexors and extensors of the bilateral upper extremities.   Mikey Bussing is negative bilaterally.        L-Spine   There is lumbar paraspinal tenderness. Paraspinal muscle tone is increased.  Facet loading is positive  ROM with flexion, extension, rotation, and lateral bending is preserved.  SLR is positive in the LLE.       MRI C-Spine  FINDINGS:     Comparison is made with previous outside MRI of the cervical spine from   April 21, 2018.     The axial images are mildly limited by motion.     The cervical vertebral body heights and alignment are normal. There are no   aggressive or destructive geographic marrow lesions. There is no   paraspinal mass.     The visualized portions of the posterior fossa are unremarkable. The   foramen magnum is widely patent. The cervical and visualized upper   thoracic cord is normal in signal and caliber.     The level by level description is as follows:     C2-3: No spinal or foraminal stenosis     C3-4: Minimal posterior disc osteophyte complex. Asymmetric left   uncovertebral joint enlargement.. Mild left foraminal stenosis. No central   stenosis or right foraminal stenosis.     C4-5: Minimal posterior disc osteophyte complex. Asymmetric left   uncovertebral joint enlargement. Mild left foraminal stenosis.     C5-6: Unremarkable     C6-C7: Unremarkable     C7-T1: Unremarkable     IMPRESSION     1. Slightly limited exam secondary to motion.   2. Mild left neural foraminal stenosis at C3-4 and C4-5 as discussed.   3. No significant central spinal stenosis. No abnormal cord signal       MRI L-Spine    FINDINGS:     Comparison is made with prior MRIs from December 17, 2017 (preoperative)   and Mar 16, 2019 (postoperative)     There are 5 nonrib-bearing lumbar-type vertebral bodies. There are   postsurgical changes of a left L4 hemilaminectomy. These findings are   discussed further below.     There are no aggressive or destructive osseous lesions. There are several   small osseous hemangiomas. There are degenerative marrow signal changes   along several endplates.     There is no paraspinal mass.     The conus extends to the L1 level and is unremarkable.     The level by level description is as follows:     T12-L1: Unremarkable     L1-L2: Mild disc bulge. No spinal or foraminal stenosis.     L2-L3: Mild disc bulge. No spinal or  foraminal stenosis.     L3-L4: Mild disc bulge asymmetrically greatest at the right foraminal/far   lateral location. Minimal lateral right foraminal stenosis (sagittal image   13 of series 3). No central spinal stenosis or left foraminal stenosis.     L4-L5: Prior left hemilaminectomy. Mild symmetric disc bulge. No   significant central stenosis. Minimal caudal bilateral foraminal stenosis.     L5-S1: Loss of disc height. There is both small central protrusion and   generalized disc bulge. The generalized bulges greater on the far lateral   location at the left. There is mild to moderate caudal far lateral left   foraminal stenosis.     IMPRESSION     1. Previous L4 left hemilaminectomy. There is a stable mild symmetric disc   bulge at L4-L5 without significant central stenosis. Minimal stable caudal   bilateral foraminal stenosis at this level.   2. L5-S1 central disc protrusion as well as asymmetric disc bulge greatest   at the far lateral location on the left. There is mild to moderate caudal   far lateral left foraminal stenosis.   3. Minimal lateral right foraminal stenosis at L3-4 secondary to   asymmetric L3-4 disc bulge.     Last Cr and LFT's:  Creatinine   Date Value Ref Range Status   10/24/2020 1.03 0.4 - 1.24 MG/DL Final     AST (SGOT)   Date Value Ref Range Status   10/24/2020 17 7 - 40 U/L Final     ALT (SGPT)   Date Value Ref Range Status   10/24/2020 21 7 - 56 U/L Final     Alk Phosphatase   Date Value Ref Range Status   10/24/2020 80 25 - 110 U/L Final     Total Bilirubin   Date Value Ref Range Status   10/24/2020 0.5 0.3 - 1.2 MG/DL Final          Assessment:    Frank Jenkins is a 66 y.o. male who  has a past medical history of Arthritis, Blind painful right eye, Cataract, Disorder of thyroid gland, Gastrointestinal disorder, Generalized headaches, Joint pain (1995), Macular degeneration, and PONV (postoperative nausea and vomiting). who presents for evaluation of pain.    The pain complaints are most likely due to:    1. Spondylosis of cervical region without myelopathy or radiculopathy        2. Facet arthropathy        3. DDD (degenerative disc disease), cervical        4. Myofascial pain            Patient has had an adequate trial of > 12 months of rest, exercise, multimodal treatment, and the passage of time without improvement of symptoms. The pain has significant impact on the daily quality of life.     Plan:    1. Discussed care options with patient. Plan on bilateral C6-T1 MBB at next available appointment, for consideration of RFA.   Facetogenic pain >3 mos  Failed to improve with conservative treatment including pharmacologic, passage of time, and exercise.   Imaging consistent with pain.   Other diagnoses ruled out.   Baseline Oswestry Total Score:: 30    2. Ok to stop cymbalta as patient has essentially tapered himself off  3. D/c flexeril and start robaxin 500mg  TID PRN  4. Encouraged to continue at home PT program.  5. Plan for Left L4-S1 TFESI in PROCEDURES.   This patient's clinical history, exam,  AND imaging support radiculopathy AND there is a significant impact on quality of life and function AND the pain has been present for at least 4 weeks AND they have failed to improve with noninvasive conservative care.     This patient had at least 50% pain relief for at least 3 months with the last epidural injection.     6. Follow up as needed.     Risks/benefits of all pharmacologic and interventional treatments discussed and questions answered.

## 2022-02-28 ENCOUNTER — Emergency Department: Admit: 2022-02-28 | Discharge: 2022-02-28 | Payer: BC Managed Care – PPO

## 2022-02-28 ENCOUNTER — Encounter: Admit: 2022-02-28 | Discharge: 2022-02-28

## 2022-02-28 ENCOUNTER — Emergency Department: Admit: 2022-02-28 | Discharge: 2022-02-28

## 2022-02-28 DIAGNOSIS — R1013 Epigastric pain: Secondary | ICD-10-CM

## 2022-02-28 LAB — COMPREHENSIVE METABOLIC PANEL
ALBUMIN: 3.9 g/dL (ref 3.5–5.0)
ALK PHOSPHATASE: 70 U/L — ABNORMAL LOW (ref 25–110)
ALT: 20 U/L (ref 7–56)
ANION GAP: 10 K/UL (ref 3–12)
AST: 14 U/L (ref 7–40)
CO2: 26 MMOL/L (ref 21–30)
EGFR: >60 mL/min (ref >60)
GLUCOSE,PANEL: 86 mg/dL (ref <20)
SODIUM: 142 MMOL/L — ABNORMAL LOW (ref 137–147)
TOTAL BILIRUBIN: 0.5 mg/dL (ref 0.3–1.2)
TOTAL PROTEIN: 6.5 g/dL (ref 6.0–8.0)

## 2022-02-28 LAB — URINALYSIS DIPSTICK REFLEX TO CULTURE
LEUKOCYTES: NEGATIVE
NITRITE: NEGATIVE
URINE ASCORBIC ACID, UA: NEGATIVE
URINE BILE: NEGATIVE
URINE BLOOD: NEGATIVE
URINE KETONE: NEGATIVE

## 2022-02-28 LAB — URINALYSIS MICROSCOPIC REFLEX TO CULTURE

## 2022-02-28 LAB — HIGH SENSITIVITY TROPONIN I 4 HR
HI SEN TNI 4 HR: 5 ng/L (ref <20)
HI SEN TNI DELTA 4-2: 1 mg/dL (ref 0.4–1.24)

## 2022-02-28 LAB — BNP (B-TYPE NATRIURETIC PEPTI): BNP: 9 pg/mL (ref 0–100)

## 2022-02-28 LAB — CBC AND DIFF
ABSOLUTE BASO COUNT: 0 K/UL (ref 0–0.20)
ABSOLUTE EOS COUNT: 0 K/UL (ref 0–0.45)
ABSOLUTE MONO COUNT: 0.5 K/UL (ref 0–0.80)
HEMOGLOBIN: 12 g/dL — ABNORMAL LOW (ref 13.5–16.5)
MDW (MONOCYTE DISTRIBUTION WIDTH): 18 (ref <20.7)
RDW: 14 % (ref 11–15)
WBC COUNT: 7.2 K/UL (ref 4.5–11.0)

## 2022-02-28 LAB — HIGH SENSITIVITY TROPONIN I 0 HOUR: HIGH SENSITIVITY TROPONIN I 0 HOUR: 4 ng/L — ABNORMAL LOW (ref <20)

## 2022-02-28 MED ORDER — LACTATED RINGERS IV SOLP
1000 mL | INTRAVENOUS | 0 refills | Status: CP
Start: 2022-02-28 — End: ?
  Administered 2022-03-01: 01:00:00 1000 mL via INTRAVENOUS

## 2022-02-28 MED ORDER — SODIUM CHLORIDE 0.9 % IJ SOLN
50 mL | Freq: Once | INTRAVENOUS | 0 refills | Status: CP
Start: 2022-02-28 — End: ?
  Administered 2022-03-01: 03:00:00 50 mL via INTRAVENOUS

## 2022-02-28 MED ORDER — IOHEXOL 350 MG IODINE/ML IV SOLN
100 mL | Freq: Once | INTRAVENOUS | 0 refills | Status: CP
Start: 2022-02-28 — End: ?
  Administered 2022-03-01: 03:00:00 100 mL via INTRAVENOUS

## 2022-02-28 NOTE — ED Notes
P/t ambulated to cart w/ steady gait.  Endorses lower back pain, RUQ/LUQ abdominal pain and CP x 1 month.  Changes today include worsening of pain that has improved.  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  P/t has a family member that was  consulted and they were urged to seek evaluation today due to family h/x of gallbladder problems.  P/t speaking in full sentences, denies any worsening of symptoms of SOB w/ laying flat, palpation or increased activity.  Bed in low/locked position, side rails up x2, call light in reach.  P/t remains on monitor. No needs identified at this time. RR even and unlabored A&Ox4, GCS 15, skin W/P/D

## 2022-03-01 NOTE — ED Notes
Pt's wife stuck her head out the door and asked when patient was going to CT. This RN called CT who stated patient was next to be imaged. Upon return to room, patient stated his pain was better than it was but still had a sharp, constant pain in his epigastric area spreading laterally across his upper stomach. Declined pain medication or antacids. Dr. Kayren Eaves to bedside at this time as well. Pt assisted to position of comfort and given blankets as he stated he was cold. Pt AAOx4 in NAD. Bilateral chest rise and fall. Bed in lowest, locked position and call light in reach. Pt updated on plan of care and denies having any needs at this time.

## 2022-03-01 NOTE — ED Notes
Received report from Heidi, RN. Care assumed at this time.

## 2022-03-02 ENCOUNTER — Encounter: Admit: 2022-03-02 | Discharge: 2022-03-02

## 2022-03-02 DIAGNOSIS — R110 Nausea: Secondary | ICD-10-CM

## 2022-03-02 DIAGNOSIS — Z800 Family history of malignant neoplasm of digestive organs: Secondary | ICD-10-CM

## 2022-03-02 DIAGNOSIS — Z86010 Personal history of colonic polyps: Secondary | ICD-10-CM

## 2022-03-02 DIAGNOSIS — R1084 Generalized abdominal pain: Secondary | ICD-10-CM

## 2022-03-02 MED ORDER — CLENPIQ 10 MG-3.5 GRAM- 12 GRAM/160 ML PO SOLN
0 refills | Status: AC
Start: 2022-03-02 — End: ?

## 2022-03-03 ENCOUNTER — Encounter: Admit: 2022-03-03 | Discharge: 2022-03-03

## 2022-03-03 ENCOUNTER — Ambulatory Visit: Admit: 2022-03-03 | Discharge: 2022-03-03 | Payer: BC Managed Care – PPO

## 2022-03-03 DIAGNOSIS — Z800 Family history of malignant neoplasm of digestive organs: Secondary | ICD-10-CM

## 2022-03-03 DIAGNOSIS — K929 Disease of digestive system, unspecified: Secondary | ICD-10-CM

## 2022-03-03 DIAGNOSIS — R112 Nausea with vomiting, unspecified: Secondary | ICD-10-CM

## 2022-03-03 DIAGNOSIS — R1084 Generalized abdominal pain: Secondary | ICD-10-CM

## 2022-03-03 DIAGNOSIS — H3530 Unspecified macular degeneration: Secondary | ICD-10-CM

## 2022-03-03 DIAGNOSIS — N390 Urinary tract infection, site not specified: Secondary | ICD-10-CM

## 2022-03-03 DIAGNOSIS — Z86010 Personal history of colonic polyps: Secondary | ICD-10-CM

## 2022-03-03 DIAGNOSIS — H5440 Blindness, one eye, unspecified eye: Secondary | ICD-10-CM

## 2022-03-03 DIAGNOSIS — R110 Nausea: Secondary | ICD-10-CM

## 2022-03-03 DIAGNOSIS — R519 Generalized headaches: Secondary | ICD-10-CM

## 2022-03-03 DIAGNOSIS — H269 Unspecified cataract: Secondary | ICD-10-CM

## 2022-03-03 DIAGNOSIS — M1990 Unspecified osteoarthritis, unspecified site: Secondary | ICD-10-CM

## 2022-03-03 DIAGNOSIS — M2550 Pain in unspecified joint: Secondary | ICD-10-CM

## 2022-03-03 DIAGNOSIS — E079 Disorder of thyroid, unspecified: Secondary | ICD-10-CM

## 2022-03-06 ENCOUNTER — Encounter: Admit: 2022-03-06 | Discharge: 2022-03-06

## 2022-03-10 ENCOUNTER — Encounter: Admit: 2022-03-10 | Discharge: 2022-03-10

## 2022-03-10 ENCOUNTER — Ambulatory Visit: Admit: 2022-03-10 | Discharge: 2022-03-10

## 2022-03-10 DIAGNOSIS — H5440 Blindness, one eye, unspecified eye: Secondary | ICD-10-CM

## 2022-03-10 DIAGNOSIS — R519 Generalized headaches: Secondary | ICD-10-CM

## 2022-03-10 DIAGNOSIS — K929 Disease of digestive system, unspecified: Secondary | ICD-10-CM

## 2022-03-10 DIAGNOSIS — M2550 Pain in unspecified joint: Secondary | ICD-10-CM

## 2022-03-10 DIAGNOSIS — R112 Nausea with vomiting, unspecified: Secondary | ICD-10-CM

## 2022-03-10 DIAGNOSIS — H269 Unspecified cataract: Secondary | ICD-10-CM

## 2022-03-10 DIAGNOSIS — H3530 Unspecified macular degeneration: Secondary | ICD-10-CM

## 2022-03-10 DIAGNOSIS — E079 Disorder of thyroid, unspecified: Secondary | ICD-10-CM

## 2022-03-10 DIAGNOSIS — M1990 Unspecified osteoarthritis, unspecified site: Secondary | ICD-10-CM

## 2022-03-10 DIAGNOSIS — N390 Urinary tract infection, site not specified: Secondary | ICD-10-CM

## 2022-03-10 MED ORDER — PROPOFOL 10 MG/ML IV EMUL 50 ML (INFUSION)(AM)(OR)
INTRAVENOUS | 0 refills | Status: DC
Start: 2022-03-10 — End: 2022-03-10

## 2022-03-10 MED ORDER — GLYCOPYRROLATE 0.2 MG/ML IJ SOLN
INTRAVENOUS | 0 refills | Status: DC
Start: 2022-03-10 — End: 2022-03-10

## 2022-03-10 NOTE — Anesthesia Pre-Procedure Evaluation
Anesthesia Pre-Procedure Evaluation    Name: Frank Jenkins      MRN: [id number]     DOB: 19-Dec-1955     Age: 66 y.o.     Sex: male   _________________________________________________________________________     Procedure Info:   Procedure Information     Date/Time: 03/10/22 0730    Procedures:       Colonoscopy      ESOPHAGOGASTRODUODENOSCOPY WITH BIOPSY - FLEXIBLE    Location: [id number]    Surgeons: Tempie Hoist, DO          Physical Assessment  Vital Signs (last filed in past 24 hours):         Patient History   Allergies   Allergen Reactions   ? Albuterol RASH and EDEMA   ? Aspirin ANAPHYLAXIS and EDEMA   ? Ibuprofen ANAPHYLAXIS   ? Ketorolac ANAPHYLAXIS   ? Nsaids (Non-Steroidal Anti-Inflammatory Drug) ANAPHYLAXIS        Current Medications    Medication Directions   acetaminophen (TYLENOL) 500 mg tablet Take one tablet by mouth every 6 hours as needed for Pain. Max of 4,000 mg of acetaminophen in 24 hours.   cyclobenzaprine (FLEXERIL) 5 mg tablet Take one tablet to two tablets by mouth twice daily as needed for Muscle Cramps.   duloxetine DR (CYMBALTA) 60 mg capsule Take 1 capsule by mouth once daily   famotidine (PEPCID) 40 mg tablet Take one tablet by mouth at bedtime daily.   finasteride (PROSCAR) 5 mg tablet Take one tablet by mouth daily.   levothyroxine (SYNTHROID) 50 mcg tablet Take one tablet by mouth daily 30 minutes before breakfast.   methyl salicylate/menthol (BENGAY TP) Apply  topically to affected area daily as needed.   pantoprazole DR (PROTONIX) 40 mg tablet Take one tablet by mouth every morning. Indications: gastroesophageal reflux disease   sod picosulf-mag ox-citric ac (CLENPIQ) 10 mg-3.5 gram- 12 gram/160 mL oral solution Split dose by mouth per MyChart portal prep instructions         Review of Systems/Medical History      Patient summary reviewed  Nursing notes reviewed  Pertinent labs reviewed    PONV Screening: Non-smoker and Hx PONV/motion sickness  No history of anesthetic complications  No family history of anesthetic complications      Airway - negative        Pulmonary - negative          Cardiovascular - negative        Exercise tolerance: >4 METS      Beta Blocker therapy: No        GI/Hepatic/Renal       Inflammatory bowel disease      GERD,         Bowel prep      Neuro/Psych         Headaches      Musculoskeletal         Back pain      Arthritis:       Endocrine/Other         Hypothyroidism     Physical Exam    Airway Findings      Mallampati: III      TM distance: >3 FB      Neck ROM: full      Mouth opening: good      Airway patency: adequate    Dental Findings: Negative      Cardiovascular Findings: Negative  Rhythm: regular      Rate: normal    Pulmonary Findings: Negative      Breath sounds clear to auscultation.    Abdominal Findings:       Not obese    Neurological Findings:       Alert and oriented x 3    Normal mental status    Constitutional findings:       No acute distress      Well-developed      Well-nourished       Diagnostic Tests  Hematology:   Lab Results   Component Value Date    HGB 12.9 02/28/2022    HCT 39.2 02/28/2022    PLTCT 267 02/28/2022    WBC 7.2 02/28/2022    NEUT 73 02/28/2022    ANC 5.22 02/28/2022    ALC 1.33 02/28/2022    MONA 8 02/28/2022    AMC 0.59 02/28/2022    EOSA 1 02/28/2022    ABC 0.03 02/28/2022    MCV 91.6 02/28/2022    MCH 30.2 02/28/2022    MCHC 33.0 02/28/2022    MPV 7.5 02/28/2022    RDW 14.6 02/28/2022         General Chemistry:   Lab Results   Component Value Date    NA 142 02/28/2022    K 3.7 02/28/2022    CL 106 02/28/2022    CO2 26 02/28/2022    GAP 10 02/28/2022    BUN 17 02/28/2022    CR 1.08 02/28/2022    GLU 86 02/28/2022    CA 8.9 02/28/2022    ALBUMIN 3.9 02/28/2022    TOTBILI 0.5 02/28/2022      Coagulation: No results found for: PT, PTT, INR      Anesthesia Plan    ASA score: 2   Plan: MAC  Induction method: intravenous  NPO status: acceptable      Informed Consent  Anesthetic plan and risks discussed with patient.        Plan discussed with: anesthesiologist and CRNA.

## 2022-03-10 NOTE — Anesthesia Post-Procedure Evaluation
Post-Anesthesia Evaluation    Name: Frank Jenkins      MRN: [id number]     DOB: 1956-07-04     Age: 66 y.o.     Sex: male   __________________________________________________________________________     Procedure Information     Anesthesia Start Date/Time: 03/10/22 0734    Procedures:       Colonoscopy      ESOPHAGOGASTRODUODENOSCOPY WITH BIOPSY - FLEXIBLE    Location: [id number]    Surgeons: Tempie Hoist, DO          Post-Anesthesia Vitals  BP: 120/77 (05/09 0841)  Temp: 36.8 C (98.2 F) (05/09 2330)  Pulse: 61 (05/09 0841)  Respirations: 15 PER MINUTE (05/09 0841)  SpO2: 96 % (05/09 0841)  SpO2 Pulse: 77 (05/09 0600)  O2 Device: None (Room air) (05/09 0841)  Height: 177.8 cm (5\' 10" ) (05/09 0600)   Vitals Value Taken Time   BP 120/77 03/10/22 0841   Temp 36.8 C (98.2 F) 03/10/22 0812   Pulse 61 03/10/22 0841   Respirations 15 PER MINUTE 03/10/22 0841   SpO2 96 % 03/10/22 0841   O2 Device None (Room air) 03/10/22 0841   ABP     ART BP           Post Anesthesia Evaluation Note    Evaluation location: Pre/Post  Patient participation: recovered; patient participated in evaluation  Level of consciousness: alert    Pain score: 0  Pain management: adequate    Hydration: normovolemia  Temperature: 36.0C - 38.4C  Airway patency: adequate    Perioperative Events       Post-op nausea and vomiting: no PONV    Postoperative Status  Cardiovascular status: hemodynamically stable  Respiratory status: spontaneous ventilation  Follow-up needed: none        Perioperative Events  There were no known notable events for this encounter.

## 2022-03-11 ENCOUNTER — Encounter: Admit: 2022-03-11 | Discharge: 2022-03-11

## 2022-03-11 DIAGNOSIS — M1990 Unspecified osteoarthritis, unspecified site: Secondary | ICD-10-CM

## 2022-03-11 DIAGNOSIS — K929 Disease of digestive system, unspecified: Secondary | ICD-10-CM

## 2022-03-11 DIAGNOSIS — H5440 Blindness, one eye, unspecified eye: Secondary | ICD-10-CM

## 2022-03-11 DIAGNOSIS — R112 Nausea with vomiting, unspecified: Secondary | ICD-10-CM

## 2022-03-11 DIAGNOSIS — E079 Disorder of thyroid, unspecified: Secondary | ICD-10-CM

## 2022-03-11 DIAGNOSIS — M2550 Pain in unspecified joint: Secondary | ICD-10-CM

## 2022-03-11 DIAGNOSIS — H3530 Unspecified macular degeneration: Secondary | ICD-10-CM

## 2022-03-11 DIAGNOSIS — H269 Unspecified cataract: Secondary | ICD-10-CM

## 2022-03-11 DIAGNOSIS — R519 Generalized headaches: Secondary | ICD-10-CM

## 2022-03-11 DIAGNOSIS — N390 Urinary tract infection, site not specified: Secondary | ICD-10-CM

## 2022-03-19 ENCOUNTER — Encounter: Admit: 2022-03-19 | Discharge: 2022-03-19

## 2022-03-19 ENCOUNTER — Ambulatory Visit: Admit: 2022-03-19 | Discharge: 2022-03-19 | Payer: BC Managed Care – PPO

## 2022-03-19 ENCOUNTER — Ambulatory Visit: Admit: 2022-03-19 | Discharge: 2022-03-19

## 2022-03-19 DIAGNOSIS — M1990 Unspecified osteoarthritis, unspecified site: Secondary | ICD-10-CM

## 2022-03-19 DIAGNOSIS — M2550 Pain in unspecified joint: Secondary | ICD-10-CM

## 2022-03-19 DIAGNOSIS — M5137 Other intervertebral disc degeneration, lumbosacral region: Secondary | ICD-10-CM

## 2022-03-19 DIAGNOSIS — E079 Disorder of thyroid, unspecified: Secondary | ICD-10-CM

## 2022-03-19 DIAGNOSIS — H5440 Blindness, one eye, unspecified eye: Secondary | ICD-10-CM

## 2022-03-19 DIAGNOSIS — R112 Nausea with vomiting, unspecified: Secondary | ICD-10-CM

## 2022-03-19 DIAGNOSIS — H3530 Unspecified macular degeneration: Secondary | ICD-10-CM

## 2022-03-19 DIAGNOSIS — M5417 Radiculopathy, lumbosacral region: Secondary | ICD-10-CM

## 2022-03-19 DIAGNOSIS — R519 Generalized headaches: Secondary | ICD-10-CM

## 2022-03-19 DIAGNOSIS — N390 Urinary tract infection, site not specified: Secondary | ICD-10-CM

## 2022-03-19 DIAGNOSIS — H269 Unspecified cataract: Secondary | ICD-10-CM

## 2022-03-19 DIAGNOSIS — K929 Disease of digestive system, unspecified: Secondary | ICD-10-CM

## 2022-03-19 MED ORDER — LIDOCAINE (PF) 10 MG/ML (1 %) IJ SOLN
2 mL | Freq: Once | INTRAMUSCULAR | 0 refills | Status: CP
Start: 2022-03-19 — End: ?

## 2022-03-19 MED ORDER — DEXAMETHASONE SODIUM PHOS (PF) 10 MG/ML IJ EPIDURAL SOLN
15 mg | Freq: Once | EPIDURAL | 0 refills | Status: CP
Start: 2022-03-19 — End: ?

## 2022-03-19 MED ORDER — IOHEXOL 300 MG IODINE/ML IV SOLN
1 mL | Freq: Once | 0 refills | Status: CP
Start: 2022-03-19 — End: ?

## 2022-03-19 NOTE — Progress Notes
I have examined the patient, and there are no significant changes in their condition, from the previous H&P performed on 02/27/22.     Left L4-S1 TFESI    This patient's clinical history, exam, AND imaging support radiculopathy AND there is a significant impact on quality of life and function AND the pain has been present for at least 4 weeks AND they have failed to improve with noninvasive conservative care.     This patient had at least 50% pain relief for at least 3 months with the last epidural injection.         [facility] - Inter[facility]    Subjective     Chief Complaint: multifocal pain    Interval HPI: Frank Jenkins is a 66 y.o. male who  has a past medical history of Arthritis, Blind painful right eye, Cataract, Disorder of thyroid gland, Gastrointestinal disorder, Generalized headaches, Joint pain (1995), Macular degeneration, and PONV (postoperative nausea and vomiting). who now presents for evaluation.      Patient presents to clinic today with neck and back pain.  Underwent a CESI on 11/21/2020 which offered minimal relief from his pain. Primary pain concern today is his neck.    Neck pain  He describes the pain as follows:  -pain started >20 years ago  -it is localized to neck  Axial pain only  No UE radicular pain  -numbness/tingling: none  -initial inciting injury or event: in 1995 had 400lb of flour fall on his back during work  -pain is constant  -the pain is described as tight  -alleviating factors: flexeril  -aggravating factors: rotation of neck to the right  -the pain radiates: Yes to trapezius  -VAS 5/10  -loss of bowel or bladder control: No  -unexplained weight loss: No      LBP  He notes pain in the low back and radiating down the LLE in the posterolateral distribution.  It started after moving a patient.   He has had LBP for >20 years, this is an exacerbation.     It is painful when he bends or lifts.     +tightness.     He does not feel the Flexeril is that helpful.     He weaned off the Cymbalta.     Hx of spine surgery: L4-L5 Discectomy - Dr. Lorel Monaco    Patient denies fevers, chills, infection, bleeding issues, anticoagulants, new muscle weakness, numbness, tingling, bowel/bladder incontinence, or saddle anesthesia.           PRIOR MEDICATIONS:   Effective      Ineffective  Tylenol  Cymbalta  Flexeril    Unable to tolerate  NSAID - severe allergy  Tizanidine    Never  Gabapentin  Lyrica  Ami/Nortriptyline  Methocarbamol    PRIOR INTERVENTIONS:  L-spine surgery 03/2018 at [place]  Effective  left L4-S1 TFESI   TPI  LESI (prior to surgery)  CESI  Right L4-S1    Ineffective  Exercise, limited by pain  Recent CESI - minimal relief      Frank Jenkins denies any recent fevers, chills, infection, antibiotics, bowel or bladder incontinence, saddle anesthesia, bleeding issues, or recent anticoagulant.     Past Medical History:  Medical History:   Diagnosis Date   ? Arthritis    ? Blind painful right eye    ? Cataract    ? Disorder of thyroid gland     hypothyroidectomy   ? Gastrointestinal disorder  GERD   ? Generalized headaches    ? Joint pain 1995    Neck, back, knees, shoulder   ? Macular degeneration     left eye   ? PONV (postoperative nausea and vomiting)        Family History:  Family History   Problem Relation Age of Onset   ? Cataract Mother    ? Hypertension Mother    ? Hyperlipidemia Mother    ? Thyroid Disease Mother    ? Cancer Father         Lung cancer   ? Cancer Sister         Breast cancer   ? Cancer Brother         Colon cancer   ? Cancer Sister         Breast cancer   ? Cancer Sister         Surviving breast cancer   ? 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   Social History     Socioeconomic History   ? Marital status: Married   Tobacco Use   ? Smoking status: Former     Packs/day: 0.25     Years: 4.00     Pack years: 1.00     Types: Cigarettes Quit date: 01/15/1979     Years since quitting: 43.1   ? Smokeless tobacco: Never   Substance and Sexual Activity   ? Alcohol use: Yes     Alcohol/week: 1.0 - 2.0 standard drink     Types: 1 - 2 Cans of beer per week     Comment: Socially   ? Drug use: No   ? Sexual activity: Yes     Partners: Female     Birth control/protection: Post-menopausal       Allergies:  Allergies   Allergen Reactions   ? Albuterol RASH and EDEMA   ? Aspirin ANAPHYLAXIS and EDEMA   ? Ibuprofen ANAPHYLAXIS   ? Ketorolac ANAPHYLAXIS   ? Nsaids (Non-Steroidal Anti-Inflammatory Drug) ANAPHYLAXIS       Medications:    Current Outpatient Medications:   ?  acetaminophen (TYLENOL) 500 mg tablet, Take one tablet by mouth every 6 hours as needed for Pain. Max of 4,000 mg of acetaminophen in 24 hours., Disp: , Rfl:   ?  cyclobenzaprine (FLEXERIL) 5 mg tablet, Take one tablet to two tablets by mouth twice daily as needed for Muscle Cramps., Disp: 90 tablet, Rfl: 1  ?  duloxetine DR (CYMBALTA) 60 mg capsule, Take 1 capsule by mouth once daily, Disp: 30 capsule, Rfl: 5  ?  ergocalciferol (vitamin D2) (VITAMIN D PO), Take  by mouth daily., Disp: , Rfl:   ?  famotidine (PEPCID) 40 mg tablet, Take one tablet by mouth at bedtime daily., Disp: 90 tablet, Rfl: 3  ?  hypromellose/dextran (OCUCOAT) drop, Instill 1 drop into both eyes daily as needed, Disp: , Rfl:   ?  levothyroxine (SYNTHROID) 50 mcg tablet, Take one tablet by mouth daily 30 minutes before breakfast., Disp: , Rfl:   ?  methyl salicylate/menthol (BENGAY TP), Apply  topically to affected area daily as needed., Disp: , Rfl:   ?  other medication, one Dose. Medication Name & Strength: Preservision    Dose(how many): 1 drop    Frequency(how often): QD, Disp: , Rfl:   ?  pantoprazole DR (PROTONIX) 40 mg tablet, Take one tablet by mouth every morning. Indications: gastroesophageal reflux disease, Disp: 90 tablet, Rfl: 3    Physical examination:   BP 130/85 (BP Source: Arm, Right Upper, Patient Position: Sitting)  - Pulse 83  - Ht 175.3 cm (5' 9)  - Wt 95.3 kg (210 lb)  - SpO2 97%  - BMI 31.01 kg/m?   Pain Score: Seven        General: The patient is a well-developed, well nourished 66 year old male whom appears in no acute distress.   HEENT: Head is normocephalic and atraumatic.   Cardiac: Based on palpation, pulse appears to be regular rate and rhythm.   Pulmonary: The patient has unlabored respirations and bilateral symmetric chest excursion.   Abdomen: Soft, nontender. and nondistended.   Extremities: No clubbing, cyanosis, or edema.      Neurologic:   The patient is alert and oriented times 3.        Musculoskeletal:   There is tenderness to palpation, trigger points, increased tone, and referred pain in the cervical paraspinal, bilateral levator scapular and trapezius  muscle groups.       C-Spine   There is cervical paraspinal tenderness. Paraspinal muscle tone is increased.  +facet loading  ROM is limited due to pain with flexion, extension, rotation, and lateral bending is intact.  Strength is equal and adequate bilaterally in the flexors and extensors of the bilateral upper extremities.   Mikey Bussing is negative bilaterally.        L-Spine   There is lumbar paraspinal tenderness. Paraspinal muscle tone is increased.  Facet loading is positive  ROM with flexion, extension, rotation, and lateral bending is preserved.  SLR is positive in the LLE.       MRI C-Spine  FINDINGS:     Comparison is made with previous outside MRI of the cervical spine from   April 21, 2018.     The axial images are mildly limited by motion.     The cervical vertebral body heights and alignment are normal. There are no   aggressive or destructive geographic marrow lesions. There is no   paraspinal mass.     The visualized portions of the posterior fossa are unremarkable. The   foramen magnum is widely patent. The cervical and visualized upper   thoracic cord is normal in signal and caliber.     The level by level description is as follows:     C2-3: No spinal or foraminal stenosis     C3-4: Minimal posterior disc osteophyte complex. Asymmetric left   uncovertebral joint enlargement.. Mild left foraminal stenosis. No central   stenosis or right foraminal stenosis.     C4-5: Minimal posterior disc osteophyte complex. Asymmetric left   uncovertebral joint enlargement. Mild left foraminal stenosis.     C5-6: Unremarkable     C6-C7: Unremarkable     C7-T1: Unremarkable     IMPRESSION     1. Slightly limited exam secondary to motion.   2. Mild left neural foraminal stenosis at C3-4 and C4-5 as discussed.   3. No significant central spinal stenosis. No abnormal cord signal       MRI L-Spine    FINDINGS:     Comparison is made with prior MRIs from December 17, 2017 (preoperative)   and Mar 16, 2019 (postoperative)     There are 5 nonrib-bearing lumbar-type vertebral bodies. There are   postsurgical changes of a left L4 hemilaminectomy. These  findings are   discussed further below.     There are no aggressive or destructive osseous lesions. There are several   small osseous hemangiomas. There are degenerative marrow signal changes   along several endplates.     There is no paraspinal mass.     The conus extends to the L1 level and is unremarkable.     The level by level description is as follows:     T12-L1: Unremarkable     L1-L2: Mild disc bulge. No spinal or foraminal stenosis.     L2-L3: Mild disc bulge. No spinal or foraminal stenosis.     L3-L4: Mild disc bulge asymmetrically greatest at the right foraminal/far   lateral location. Minimal lateral right foraminal stenosis (sagittal image   13 of series 3). No central spinal stenosis or left foraminal stenosis.     L4-L5: Prior left hemilaminectomy. Mild symmetric disc bulge. No   significant central stenosis. Minimal caudal bilateral foraminal stenosis.     L5-S1: Loss of disc height. There is both small central protrusion and   generalized disc bulge. The generalized bulges greater on the far lateral   location at the left. There is mild to moderate caudal far lateral left   foraminal stenosis.     IMPRESSION     1. Previous L4 left hemilaminectomy. There is a stable mild symmetric disc   bulge at L4-L5 without significant central stenosis. Minimal stable caudal   bilateral foraminal stenosis at this level.   2. L5-S1 central disc protrusion as well as asymmetric disc bulge greatest   at the far lateral location on the left. There is mild to moderate caudal   far lateral left foraminal stenosis.   3. Minimal lateral right foraminal stenosis at L3-4 secondary to   asymmetric L3-4 disc bulge.     Last Cr and LFT's:  Creatinine   Date Value Ref Range Status   10/24/2020 1.03 0.4 - 1.24 MG/DL Final     AST (SGOT)   Date Value Ref Range Status   10/24/2020 17 7 - 40 U/L Final     ALT (SGPT)   Date Value Ref Range Status   10/24/2020 21 7 - 56 U/L Final     Alk Phosphatase   Date Value Ref Range Status   10/24/2020 80 25 - 110 U/L Final     Total Bilirubin   Date Value Ref Range Status   10/24/2020 0.5 0.3 - 1.2 MG/DL Final          Assessment:    Frank Jenkins is a 66 y.o. male who  has a past medical history of Arthritis, Blind painful right eye, Cataract, Disorder of thyroid gland, Gastrointestinal disorder, Generalized headaches, Joint pain (1995), Macular degeneration, and PONV (postoperative nausea and vomiting). who presents for evaluation of pain.    The pain complaints are most likely due to:    1. Spondylosis of cervical region without myelopathy or radiculopathy        2. Facet arthropathy        3. DDD (degenerative disc disease), cervical        4. Myofascial pain            Patient has had an adequate trial of > 12 months of rest, exercise, multimodal treatment, and the passage of time without improvement of symptoms. The pain has significant impact on the daily quality of life.     Plan:    1. Discussed care options with  patient. Plan on bilateral C6-T1 MBB at next available appointment, for consideration of RFA.   Facetogenic pain >3 mos  Failed to improve with conservative treatment including pharmacologic, passage of time, and exercise.   Imaging consistent with pain.   Other diagnoses ruled out.   Baseline Oswestry Total Score:: 30    2. Ok to stop cymbalta as patient has essentially tapered himself off  3. D/c flexeril and start robaxin 500mg  TID PRN  4. Encouraged to continue at home PT program.  5. Plan for Left L4-S1 TFESI in PROCEDURES.   This patient's clinical history, exam, AND imaging support radiculopathy AND there is a significant impact on quality of life and function AND the pain has been present for at least 4 weeks AND they have failed to improve with noninvasive conservative care.     This patient had at least 50% pain relief for at least 3 months with the last epidural injection.     6. Follow up as needed.     Risks/benefits of all pharmacologic and interventional treatments discussed and questions answered.

## 2022-03-19 NOTE — Discharge Instructions - Supplementary Instructions
GENERAL POST PROCEDURE INSTRUCTIONS  Physician: _________________________________  Procedure Completed Today:  Joint Injection (hip, knee, shoulder)  Cervical Epidural Steroid Injection  Cervical Transforaminal Steroid Injection  Trigger Point Injection  Caudal Epidural Steroid Injection  Pudendal Nerve Block  Other _____________________ Thoracic Epidural Steroid Injection  Lumbar Epidural Steroid Injection  Lumbar Transforaminal Steroid Injection  Facet Joint Injection  Celiac Nerve Block  Sacrococcygeal  Sacroiliac Joint Injection   Important information following your procedure today:  You may drive today     If you had sedation, you may NOT drive today  Rest at home for the next 6 hours.  You may then begin to resume your normal activities.  DO NOT drive any vehicle, operate any power tools, drink alcohol, make any major decisions, or sign any legal documents for the next 12 hours.  Pain relief may not be immediate. It is possible you may even experience an increase in pain during the first 24-48 hours followed by a gradual decrease of your pain.  Though the procedure is generally safe, and complications are rare, we do ask that you be aware of any of the following:  Any swelling, persistent redness, new bleeding or drainage from the site of the injection.  You should not experience a severe headache.  You should not run a fever over 101oF.  New onset of sharp, severe back and or neck pain.  New onset of upper or lower extremity numbness or weakness.  New difficulty controlling bowel or bladder function after injection.  New shortness of breath.  ** If any of these occur, please call to report this occurrence to a nurse at [phone]. If you are calling after 4:00 p.m. or on weekends or holidays, please call [phone] and ask to have the resident physician on call for the physician paged or go to your local emergency room.  You may experience soreness at the injection site. Ice can be applied at 20-minute intervals for the first 24 hours. The following day you may alternate ice with heat if you are experiencing muscle tightness, otherwise continue with ice. Ice works best at decreasing pain. Avoid application of direct heat, hot showers or hot tubs today.  Avoid strenuous activity today. You many resume your regular activities and exercise tomorrow.  Patients with diabetes may see an elevation in blood sugars for 7-10 days after the injection. It is important to pay close attention to your diet, check your blood sugars daily and report extreme elevations to the physician that manages your diabetes.  Patients taking daily blood thinners can resume their regular dose this evening.  It is important that you take all medications ordered by your pain physician. Taking medications as ordered is an important part of your pain care plan. If you cannot continue the medication plan, please notify the physician.    Possible side effects to steroids that may occur:  Flushing or redness of the face  Irritability  Fluid retention  Change in women's menses  Minor headache    If you are unable to keep your upcoming appointment, please notify the [facility] scheduler at [phone] at least 24 hours in advance. If you have questions for the surgery center, call [facility] at [phone].

## 2022-03-19 NOTE — Procedures
Attending Surgeon: Evelina Bucy, MD    Anesthesia: Local    Pre-Procedure Diagnosis:   1. Lumbosacral radiculopathy    2. DDD (degenerative disc disease), lumbosacral        Post-Procedure Diagnosis:   1. Lumbosacral radiculopathy    2. DDD (degenerative disc disease), lumbosacral             Selective Nerve Rootblock/Transforaminal Lumbar/Sacral  Procedure: transforaminal epidural    Laterality: left   on 03/19/2022 3:45 PM  Location: lumbar -  L4-5 and L5-S1      Consent:   Consent obtained: written  Consent given by: patient  Risks discussed: allergic reaction, bleeding, bruising, infection, nerve damage, no change or worsening in pain, reaction to medication, seizure, swelling and weakness  Alternatives discussed: alternative treatment, delayed treatment and no treatment  Discussed with patient the purpose of the treatment/procedure, other ways of treating my condition, including no treatment/ procedure and the risks and benefits of the alternatives. Patient has decided to proceed with treatment/procedure.        Universal Protocol:  Relevant documents: relevant documents present and verified  Site marked: the operative site was marked  Patient identity confirmed: Patient identify confirmed verbally with patient.        Time out: Immediately prior to procedure a time out was called to verify the correct patient, procedure, equipment, support staff and site/side marked as required      Procedures Details:   Indications: pain   Prep: chlorhexidine  Patient position: prone  Estimated Blood Loss: minimal  Specimens: none  Number of Levels: 2  Guidance: fluoroscopy  Contrast: Procedure confirmed with contrast under live fluoroscopy.  Needle and Epidural Catheter: quincke  Needle size: 25 G  Injection procedure: Incremental injection and Negative aspiration for blood  Patient tolerance: Patient tolerated the procedure well with no immediate complications. Pressure was applied, and hemostasis was accomplished.  Outcome: Pain relieved  Comments:   LUMBAR/SACRAL TRANSFORAMINAL EPIDURAL STEROID INJECTION PROCEDURE:    1) left L4-5 and L5-S1 transforaminal epidural steroid injection  2) Fluoroscopic needle guidance    REASON FOR PROCEDURE: Lumbar radiculopathy    PHYSICIAN: Evelina Bucy, MD    MEDICATIONS INJECTED: 0.75 mL of Dexamethasone (7.5 mg) + 0.75 ml lidocaine 1% at each level    LOCAL ANESTHETIC INJECTED: 1 mL of 1% lidocaine per site    SEDATION MEDICATIONS: None    ESTIMATED BLOOD LOSS: None    SPECIMENS REMOVED: None    COMPLICATIONS: None    TECHNIQUE: Time-out was taken to identify the correct patient, procedure and side prior to starting the procedure. Lying in a prone position, the patient was prepped and draped in the usual sterile fashion using DuraPrep and a fenestrated drape. The area to be injected was determined under fluoroscopic guidance. Local anesthetic was given by raising a skin wheal and going down to the hub of a 27-gauge 1.25-inch needle.     The 3.5-inch 25-gauge Quincke needle was advanced toward the 6 o?clock position of the pedicle at each above-named nerve root level. The needle was advanced to the final position via a lateral fluoroscopic intermittent image. Omnipaque 240 was injected under live fluoroscopy and showed epidural spread and there was no vascular runoff. After a negative aspiration, the medication was then injected.     The procedure was completed without complications and was tolerated well. The patient was monitored after the procedure. The patient (or responsible party) was given post-procedure and discharge instructions to follow at home.  The patient was discharged in stable condition.           Administrations This Visit     dexamethasone PF (DECARDON) epidural injection 15 mg     Admin Date  03/19/2022 Action  Given Dose  15 mg Route  Epidural Administered By  Evelina Bucy, MD          iohexoL (OMNIPAQUE-300) 300 mg/mL injection 1 mL     Admin Date  03/19/2022 Action  Given Dose  1 mL Route  SEE ADMIN INSTRUCTIONS Administered By  Evelina Bucy, MD          lidocaine PF 1% (10 mg/mL) injection 2 mL     Admin Date  03/19/2022 Action  Given Dose  2 mL Route  Injection Administered By  Evelina Bucy, MD              Estimated blood loss: none or minimal  Specimens: none  Patient tolerated the procedure well with no immediate complications. Pressure was applied, and hemostasis was accomplished.

## 2022-04-10 ENCOUNTER — Encounter: Admit: 2022-04-10 | Discharge: 2022-04-10

## 2022-04-10 DIAGNOSIS — M5137 Other intervertebral disc degeneration, lumbosacral region: Secondary | ICD-10-CM

## 2022-04-10 DIAGNOSIS — M5417 Radiculopathy, lumbosacral region: Secondary | ICD-10-CM

## 2022-04-12 NOTE — Progress Notes
[facility]  INTERVENTIONAL PAIN PROCEDURE HISTORY AND PHYSICAL    Chief Complaint: Pain    HISTORY OF PRESENT ILLNESS:    Patient returns today for interventional treatment of axial neck pain. Patient denies any recent fevers, chills, infection, antibiotics, coagulopathy or contra-indicated anticoagulants. Risks of the procedure were discussed including but not limited to bleeding, infection, damage to surrounding structures and reaction to medications. Patient reports understanding and has elected to proceed with the procedure.        Medial Branch Block Checklist:    Moderate to severe chronic neck/low back pain, predominately axial, that causes functional deficit measured on pain or disability scale.  Yes  Pain present for minimum of 3 months with documented failure to respond to noninvasive conservative management such as passage of time, rest, medication management     Absence of untreated radiculopathy  Yes *[x]   No []   Abscense of untreated neurogenic claudication (except for radiculopathy caused by facet joint synovial cyst)                        Yes *[x]   No []               There is no non-facet pathology per clinical assessment or radiology studies that could explain the source of the patient 's pain, including but not limited to fracture, tumor, infection, or significant deformity.             Previous Medial Branch Block injection was greater than 2 weeks      Yes  Previous radiculopathy (if present) treated by ESI injection Yes *[x]   No []      Has this level been previously ablated greater than 2 years (if prior ablation was performed) Yes *[x]   No []        This level has never had an Anterior Lumbar Interbody Fusion      Yes *[x]   No []                       Medical History:   Diagnosis Date   ? Arthritis    ? Blind painful right eye    ? Cataract    ? Disorder of thyroid gland     hypothyroidectomy   ? Gastrointestinal disorder     GERD   ? Generalized headaches    ? Joint pain 1995    Neck, back, knees, shoulder   ? Macular degeneration     left eye   ? PONV (postoperative nausea and vomiting)    ? UTI (urinary tract infection)     hospitalized in february       Surgical History:   Procedure Laterality Date   ? HX EYE SURGERY Right 1995    vitrectomy   ? HX EYE SURGERY Right 1995    retinal reattachment   ? HX EYE SURGERY Right 1997    Gas bubble   ? HX EYE SURGERY Left 1998    retinal reattachment   ? HERNIA REPAIR Bilateral 2009    inguinal   ? HX CATARACT REMOVAL Right 2013   ? LEFT LUMBAR 4-5 DISCECTOMY Left 03/08/2018    Performed by Emogene Morgan, MD at [facility]   ? ESOPHAGOGASTRODUODENOSCOPY WITH BIOPSY - FLEXIBLE N/A 09/23/2020    Performed by Buckles, Vinnie Level, MD at [facility]   ? Colonoscopy N/A 03/10/2022    Performed by Tempie Hoist, DO at [facility]   ?  ESOPHAGOGASTRODUODENOSCOPY WITH BIOPSY - FLEXIBLE N/A 03/10/2022    Performed by Tempie Hoist, DO at [facility]       family history includes Cancer in his brother, brother, father, sister, sister, and sister; Cataract in his mother; Hyperlipidemia in his mother; Hypertension in his mother; Thyroid Disease in his mother.    Social History     Socioeconomic History   ? Marital status: Married   Tobacco Use   ? Smoking status: Former     Packs/day: 0.25     Years: 4.00     Pack years: 1.00     Types: Cigarettes     Quit date: 01/15/1979     Years since quitting: 43.2   ? Smokeless tobacco: Never   Substance and Sexual Activity   ? Alcohol use: Yes     Alcohol/week: 1.0 - 2.0 standard drink of alcohol     Types: 1 - 2 Cans of beer per week     Comment: Socially   ? Drug use: No   ? Sexual activity: Yes     Partners: Female     Birth control/protection: Post-menopausal       Allergies   Allergen Reactions   ? Albuterol RASH and EDEMA   ? Aspirin ANAPHYLAXIS and EDEMA   ? Ibuprofen ANAPHYLAXIS   ? Ketorolac ANAPHYLAXIS   ? Nsaids (Non-Steroidal Anti-Inflammatory Drug) ANAPHYLAXIS           REVIEW OF SYSTEMS: 10 point ROS obtained and negative except as above.      PHYSICAL EXAM:  General: Alert, cooperative, no acute distress.  HEENT: Normocephalic, atraumatic.  Neck: Supple.  Lungs: Unlabored respirations, bilateral and equal chest excursion.  Heart: Regular rate.  Skin: Warm and dry to touch.  Abdomen: Nondistended.  MSK: No deformity  Neurological: Alert and oriented x3.        IMPRESSION:    1. Spondylosis of cervical region without myelopathy or radiculopathy         PLAN: Bilateral C6-T1 MBB #1      Chelsea Primus, MD  Anesthesiology & Interventional Pain Fellow  [phone]

## 2022-04-13 ENCOUNTER — Encounter: Admit: 2022-04-13 | Discharge: 2022-04-13

## 2022-04-13 ENCOUNTER — Ambulatory Visit: Admit: 2022-04-13 | Discharge: 2022-04-13 | Payer: BC Managed Care – PPO

## 2022-04-13 ENCOUNTER — Ambulatory Visit: Admit: 2022-04-13 | Discharge: 2022-04-13

## 2022-04-13 DIAGNOSIS — R519 Generalized headaches: Secondary | ICD-10-CM

## 2022-04-13 DIAGNOSIS — M5030 Other cervical disc degeneration, unspecified cervical region: Secondary | ICD-10-CM

## 2022-04-13 DIAGNOSIS — M1990 Unspecified osteoarthritis, unspecified site: Secondary | ICD-10-CM

## 2022-04-13 DIAGNOSIS — N390 Urinary tract infection, site not specified: Secondary | ICD-10-CM

## 2022-04-13 DIAGNOSIS — M5137 Other intervertebral disc degeneration, lumbosacral region: Secondary | ICD-10-CM

## 2022-04-13 DIAGNOSIS — M47812 Spondylosis without myelopathy or radiculopathy, cervical region: Secondary | ICD-10-CM

## 2022-04-13 DIAGNOSIS — R112 Nausea with vomiting, unspecified: Secondary | ICD-10-CM

## 2022-04-13 DIAGNOSIS — H5440 Blindness, one eye, unspecified eye: Secondary | ICD-10-CM

## 2022-04-13 DIAGNOSIS — H3530 Unspecified macular degeneration: Secondary | ICD-10-CM

## 2022-04-13 DIAGNOSIS — E079 Disorder of thyroid, unspecified: Secondary | ICD-10-CM

## 2022-04-13 DIAGNOSIS — M5417 Radiculopathy, lumbosacral region: Secondary | ICD-10-CM

## 2022-04-13 DIAGNOSIS — K929 Disease of digestive system, unspecified: Secondary | ICD-10-CM

## 2022-04-13 DIAGNOSIS — M2550 Pain in unspecified joint: Secondary | ICD-10-CM

## 2022-04-13 DIAGNOSIS — H269 Unspecified cataract: Secondary | ICD-10-CM

## 2022-04-13 MED ORDER — BUPIVACAINE (PF) 0.5 % (5 MG/ML) IJ SOLN
3 mL | Freq: Once | INTRAMUSCULAR | 0 refills | Status: CP
Start: 2022-04-13 — End: ?
  Administered 2022-04-13: 22:00:00 3 mL via INTRAMUSCULAR

## 2022-04-13 NOTE — Progress Notes

## 2022-04-13 NOTE — Patient Instructions
Discharge Instructions for Medial Branch Block    Important information following your procedure today: You may drive today    This injection is for diagnostic purposes, it is a test. Only short-term results are expected.    Though the procedure is generally safe and complications are rare, we do ask that you be aware of any of the following:   Any swelling, persistent redness, new bleeding, or drainage from the site of the injection.  You should not experience a severe headache.  You should not run a fever over 101 F.  New onset of sharp, severe back & or neck pain.  New onset of upper or lower extremity numbness or weakness.  New difficulty controlling bowel or bladder function after the injection.  New shortness of breath.    If any of these occur, please call to report this occurrence to Dr. Latif at [phone]. If you are calling after 4:00 p.m. or on weekends or holidays please call [phone] and ask to have the resident physician on call for the physician paged or go to your local emergency room.    Avoid application of direct heat, hot showers or hot tubs today.    Remain active today. Do the activities that would normally cause you pain. After the injection, you should get at least 80% relief for up to 2-4 hours.    Call back to report the results to a nurse tomorrow at Dr. Latif at [phone]. You may have to leave a message. Give your name and contact information and answer the following questions.  Did you get relief?  Percentage of relief?  How long did it last?    If you are a candidate, a scheduler will call you within the next week to schedule your next procedure. The nurse will call you back if your results were not as expected to discuss next steps in your treatment plan.      The following medications were used: Bupivicaine        If you are unable to keep your upcoming appointment, please notify the [facility] scheduler at [phone] at least 24 hours in advance.

## 2022-04-13 NOTE — Procedures
Attending Surgeon: Evelina Bucy, MD    Anesthesia: Local    Pre-Procedure Diagnosis:   1. Spondylosis of cervical region without myelopathy or radiculopathy    2. DDD (degenerative disc disease), cervical        Post-Procedure Diagnosis:   1. Spondylosis of cervical region without myelopathy or radiculopathy    2. DDD (degenerative disc disease), cervical        Pain Score: Three     AMB SPINE INJECT MBB CERV/THOR  Procedure: medial branch block    Laterality: bilateral    Location: - C5, C6 and C7      Consent:   Consent obtained: written  Consent given by: patient  Risks discussed: allergic reaction, bleeding, bruising, infection, no change or worsening in pain, reaction to medication, seizure, weakness and swelling  Alternatives discussed: alternative treatment, delayed treatment, no treatment and referral  Discussed with patient the purpose of the treatment/procedure, other ways of treating my condition, including no treatment/ procedure and the risks and benefits of the alternatives. Patient has decided to proceed with treatment/procedure.        Universal Protocol:  Relevant documents: relevant documents present and verified  Test results: test results available and properly labeled  Imaging studies: imaging studies available  Required items: required blood products, implants, devices, and special equipment available  Site marked: the operative site was marked  Patient identity confirmed: Patient identify confirmed verbally with patient.        Time out: Immediately prior to procedure a time out was called to verify the correct patient, procedure, equipment, support staff and site/side marked as required      Procedures Details:   Indications: pain and diagnostic evaluation   Prep: chlorhexidine  Patient position: prone  Number of Levels: 2  Guidance: fluoroscopy  Needle size: 25 G  Patient tolerance: Patient tolerated the procedure well with no immediate complications. Pressure was applied, and hemostasis was accomplished.  Comments:   Cervical Medial Branch Blocks with Fluoroscopy (AP Approach)    PROCEDURE:  1) C5-C7 bilateral medial branch blocks  2) Fluoroscopic needle guidance    REASON FOR PROCEDURE: Cervical spondylosis without myelopathy or radiculopathy, DDD (cervical), facet arthropathy    PHYSICIAN: Evelina Bucy, MD, MBA    MEDICATIONS INJECTED: 0.4 mL of 0.5% bupivacaine at each level    LOCAL ANESTHETIC INJECTED: Lidocaine 1%, 0.64ml at each site    SEDATION MEDICATIONS: None    ESTIMATED BLOOD LOSS: None    COMPLICATIONS: None    TECHNIQUE: Time-out was taken to identify the correct patient, procedure and side prior to starting the procedure. Lying in a prone position, the patient was prepped and draped in the usual sterile fashion using DuraPrep and a fenestrated drape. The level was determined under AP fluoroscopy.     The 25 gauge 3.5 Quincke needle was introduced to the anatomic location of the medial branch at the waist of each vertebral level utilizing intermittent fluoroscopy. Medication was then injected slowly following negative aspiration.    The procedure was completed without complications and was tolerated well. The patient was monitored after the procedure. The patient (or responsible party) was given post-procedure and discharge instructions to follow at home. The patient was discharged in stable condition.            Administrations This Visit     bupivacaine PF (MARCAINE) 0.5 % injection 3 mL     Admin Date  04/13/2022 Action  Given Dose  3 mL Route  Injection  Administered By  Evelina Bucy, MD              Estimated blood loss: none or minimal  Specimens: none  Patient tolerated the procedure well with no immediate complications. Pressure was applied, and hemostasis was accomplished.

## 2022-04-16 ENCOUNTER — Encounter: Admit: 2022-04-16 | Discharge: 2022-04-16

## 2022-05-07 ENCOUNTER — Encounter: Admit: 2022-05-07 | Discharge: 2022-05-07

## 2022-05-07 ENCOUNTER — Ambulatory Visit: Admit: 2022-05-07 | Discharge: 2022-05-08 | Payer: BC Managed Care – PPO

## 2022-05-07 DIAGNOSIS — K5000 Crohn's disease of small intestine without complications: Secondary | ICD-10-CM

## 2022-05-07 DIAGNOSIS — H3530 Unspecified macular degeneration: Secondary | ICD-10-CM

## 2022-05-07 DIAGNOSIS — R519 Generalized headaches: Secondary | ICD-10-CM

## 2022-05-07 DIAGNOSIS — M1990 Unspecified osteoarthritis, unspecified site: Secondary | ICD-10-CM

## 2022-05-07 DIAGNOSIS — N390 Urinary tract infection, site not specified: Secondary | ICD-10-CM

## 2022-05-07 DIAGNOSIS — E079 Disorder of thyroid, unspecified: Secondary | ICD-10-CM

## 2022-05-07 DIAGNOSIS — M2550 Pain in unspecified joint: Secondary | ICD-10-CM

## 2022-05-07 DIAGNOSIS — R112 Nausea with vomiting, unspecified: Secondary | ICD-10-CM

## 2022-05-07 DIAGNOSIS — Z712 Person consulting for explanation of examination or test findings: Secondary | ICD-10-CM

## 2022-05-07 DIAGNOSIS — K929 Disease of digestive system, unspecified: Secondary | ICD-10-CM

## 2022-05-07 DIAGNOSIS — H269 Unspecified cataract: Secondary | ICD-10-CM

## 2022-05-07 DIAGNOSIS — H5440 Blindness, one eye, unspecified eye: Secondary | ICD-10-CM

## 2022-05-07 DIAGNOSIS — Z800 Family history of malignant neoplasm of digestive organs: Secondary | ICD-10-CM

## 2022-05-07 MED ORDER — PANTOPRAZOLE 40 MG PO TBEC
40 mg | ORAL_TABLET | Freq: Every morning | ORAL | 3 refills | 90.00000 days | Status: AC
Start: 2022-05-07 — End: ?

## 2022-05-07 MED ORDER — FAMOTIDINE 40 MG PO TAB
40 mg | ORAL_TABLET | Freq: Every evening | ORAL | 3 refills | 90.00000 days | Status: AC
Start: 2022-05-07 — End: ?

## 2022-05-07 NOTE — Progress Notes
Date of Service: 05/07/2022    Subjective:             Frank Jenkins is a 66 y.o. male.    History of Present Illness     I had a follow-up visit today with Frank Jenkins.  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  He underwent EGD and colonoscopy Mar 10, 2022.  This showed a 2 cm hiatal hernia and some gastric fundic gland polyps but otherwise was unremarkable.  Colonoscopy showed some small terminal ileal erosions and diverticulosis but no polyps.  The patient does have a family history of Correctol cancer.  His brother died of colon cancer at age 15.  5-year surveillance colonoscopy was recommended to the patient.  He currently takes pantoprazole 40 mg once daily and famotidine at bedtime to control his reflux.  He has some right upper quadrant pain and chest and back pain.  He had a CT abdomen pelvis February 28, 2022 in the [place] emergency room which was unremarkable.  The patient follows with anesthesia pain clinic for epidural spinal injections due to chronic neck and back pain.  Patient denies any diarrhea or constipation.       Review of Systems   Gastrointestinal: Positive for abdominal pain.   A complete review of systems was obtained from the patient and all other systems were reviewed and negative.    Objective:         ? acetaminophen (TYLENOL) 500 mg tablet Take one tablet by mouth every 6 hours as needed for Pain. Max of 4,000 mg of acetaminophen in 24 hours.   ? cyclobenzaprine (FLEXERIL) 5 mg tablet Take one tablet to two tablets by mouth twice daily as needed for Muscle Cramps.   ? duloxetine DR (CYMBALTA) 60 mg capsule Take 1 capsule by mouth once daily   ? famotidine (PEPCID) 40 mg tablet Take one tablet by mouth at bedtime daily.   ? finasteride (PROSCAR) 5 mg tablet Take one tablet by mouth daily.   ? levothyroxine (SYNTHROID) 50 mcg tablet Take one tablet by mouth daily 30 minutes before breakfast.   ? methyl salicylate/menthol (BENGAY TP) Apply  topically to affected area daily as needed.   ? pantoprazole DR (PROTONIX) 40 mg tablet Take one tablet by mouth every morning. Indications: gastroesophageal reflux disease   ? sod picosulf-mag ox-citric ac (CLENPIQ) 10 mg-3.5 gram- 12 gram/160 mL oral solution Split dose by mouth per MyChart portal prep instructions     Vitals:    05/07/22 1121   BP: (!) 143/78   BP Source: Arm, Left Upper   Pulse: 65   PainSc: Three   Weight: 96.2 kg (212 lb)   Height: 175.3 cm (5' 9)     Body mass index is 31.31 kg/m?Marland Kitchen     Physical Exam  Vital signs and nurses notes reviewed  Patient alert and oriented, appropriate  Lungs clear to auscultation  Heart regular rhythm and rate  Abdomen soft, non-tender, normal bowel sounds  Neuro exam without focal deficits       Assessment and Plan:           [name] was seen today for gi problem.    Diagnoses and all orders for this visit:    Gastroesophageal reflux disease without esophagitis  -     pantoprazole DR (PROTONIX) 40 mg tablet; Take one tablet by mouth every morning. Indications: gastroesophageal reflux  disease  -     famotidine (PEPCID) 40 mg tablet; Take one tablet by mouth at bedtime daily.    Crohn's disease of small intestine without complication (HCC)    Encounter to discuss test results    RUQ pain  -     NM GALLBLADDER EJECTION FRACTION; Future; Expected date: 05/08/2022    Family history of colon cancer    Frank Jenkins has a history of very mild indolent Crohn's disease of the terminal ileum.  Currently they we are observing this and not treating with specific medications.  He is asymptomatic.  He has a family history of colon cancer.  Recent colonoscopy did not show any significant findings.  We will plan on repeat surveillance colonoscopy in about 5 years.  Patient's GERD is currently controlled with pantoprazole and famotidine.  He does get some upper abdominal pain that radiates to his back.  He is interested in exploring his gallbladder as a potential etiology of pain.  Recent CT scan did not show any stones.  I recommended scheduling a gallbladder ejection fraction to look for evidence of biliary dyskinesia.  Overall, the patient is fairly stable at this point in time.  I will have him return to GI clinic for follow-up in about 6 months.    Patient Instructions   1. Repeat EGD and colonoscopy in 5 years  2. Refill Pantoprazole daily  3. Refill Famotidine at bedtime  4. Schedule gallbladder ejection fraction    If you have questions or concerns that need to be addressed before your next scheduled office visit, please call my nurse at [phone] or send me a MyChart patient message.    A total of 30 minutes were spent today on this encounter.  This includes preparing for the visit, reviewing medical records, obtaining relevant history, speaking to the patient, interpreting test results, ordering medications and tests and documenting the visit in the electronic health record.

## 2022-05-07 NOTE — Patient Instructions
Repeat EGD and colonoscopy in 5 years  Refill Pantoprazole daily  Refill Famotidine at bedtime  Schedule gallbladder ejection fraction    If you have questions or concerns that need to be addressed before your next scheduled office visit, please call my nurse at [phone] or send me a MyChart patient message.

## 2022-05-08 ENCOUNTER — Encounter: Admit: 2022-05-08 | Discharge: 2022-05-08

## 2022-05-08 DIAGNOSIS — K219 Gastro-esophageal reflux disease without esophagitis: Secondary | ICD-10-CM

## 2022-05-08 DIAGNOSIS — R1011 Right upper quadrant pain: Secondary | ICD-10-CM

## 2022-05-09 ENCOUNTER — Encounter: Admit: 2022-05-09 | Discharge: 2022-05-09

## 2022-05-11 ENCOUNTER — Encounter: Admit: 2022-05-11 | Discharge: 2022-05-11

## 2022-05-11 ENCOUNTER — Ambulatory Visit: Admit: 2022-05-11 | Discharge: 2022-05-11

## 2022-05-11 ENCOUNTER — Ambulatory Visit: Admit: 2022-05-11 | Discharge: 2022-05-11 | Payer: BC Managed Care – PPO

## 2022-05-11 DIAGNOSIS — M47812 Spondylosis without myelopathy or radiculopathy, cervical region: Secondary | ICD-10-CM

## 2022-05-11 DIAGNOSIS — M5030 Other cervical disc degeneration, unspecified cervical region: Secondary | ICD-10-CM

## 2022-05-11 MED ORDER — BUPIVACAINE (PF) 0.5 % (5 MG/ML) IJ SOLN
3 mL | Freq: Once | INTRAMUSCULAR | 0 refills | Status: CP
Start: 2022-05-11 — End: ?
  Administered 2022-05-11: 21:00:00 3 mL via INTRAMUSCULAR

## 2022-05-11 NOTE — Progress Notes
[facility]  INTERVENTIONAL PAIN PROCEDURE HISTORY AND PHYSICAL    Chief Complaint: Pain    HISTORY OF PRESENT ILLNESS:  Frank Jenkins presents today for interventional treatment of his pain. He denies any fevers, chills, infection, or current use of contraindicated anticoagulants. Risks of the procedure were discussed including but not limited to bleeding, infection, damage to surrounding structures and reaction to medications. He reports understanding and has elected to proceed with the procedure.    Medical History:   Diagnosis Date   ? Arthritis    ? Blind painful right eye    ? Cataract    ? Disorder of thyroid gland     hypothyroidectomy   ? Gastrointestinal disorder     GERD   ? Generalized headaches    ? Joint pain 1995    Neck, back, knees, shoulder   ? Macular degeneration     left eye   ? PONV (postoperative nausea and vomiting)    ? UTI (urinary tract infection)     hospitalized in february       Surgical History:   Procedure Laterality Date   ? HX EYE SURGERY Right 1995    vitrectomy   ? HX EYE SURGERY Right 1995    retinal reattachment   ? HX EYE SURGERY Right 1997    Gas bubble   ? HX EYE SURGERY Left 1998    retinal reattachment   ? HERNIA REPAIR Bilateral 2009    inguinal   ? HX CATARACT REMOVAL Right 2013   ? LEFT LUMBAR 4-5 DISCECTOMY Left 03/08/2018    Performed by Emogene Morgan, MD at [facility] OR   ? ESOPHAGOGASTRODUODENOSCOPY WITH BIOPSY - FLEXIBLE N/A 09/23/2020    Performed by Buckles, Vinnie Level, MD at [facility] OR   ? Colonoscopy N/A 03/10/2022    Performed by Tempie Hoist, DO at [facility] OR   ? ESOPHAGOGASTRODUODENOSCOPY WITH BIOPSY - FLEXIBLE N/A 03/10/2022    Performed by Tempie Hoist, DO at [facility] OR       family history includes Cancer in his brother, brother, father, sister, sister, and sister; Cataract in his mother; Hyperlipidemia in his mother; Hypertension in his mother; Thyroid Disease in his mother.    Social History     Socioeconomic History   ? Marital status: Married   Tobacco Use   ? Smoking status: Former     Packs/day: 0.25     Years: 4.00     Pack years: 1.00     Types: Cigarettes     Quit date: 01/15/1979     Years since quitting: 43.3   ? Smokeless tobacco: Never   Substance and Sexual Activity   ? Alcohol use: Yes     Alcohol/week: 1.0 - 2.0 standard drink of alcohol     Types: 1 - 2 Cans of beer per week     Comment: Socially   ? Drug use: No   ? Sexual activity: Yes     Partners: Female     Birth control/protection: Post-menopausal       Allergies   Allergen Reactions   ? Albuterol RASH and EDEMA   ? Aspirin ANAPHYLAXIS and EDEMA   ? Ibuprofen ANAPHYLAXIS   ? Ketorolac ANAPHYLAXIS   ? Nsaids (Non-Steroidal Anti-Inflammatory Drug) ANAPHYLAXIS       Vitals:    05/11/22 1537   BP: 128/79   BP Source: Arm, Right Upper   Pulse: 82   Temp: 36.7 ?  C (98.1 ?F)   Resp: 16   SpO2: 98%   TempSrc: Oral   PainSc: Four   Weight: 93.9 kg (207 lb)   Height: 175.3 cm (5' 9)       REVIEW OF SYSTEMS: 10 point ROS obtained and negative except pain    PHYSICAL EXAM:  General: Alert, cooperative, no distress  Head: Normocephalic, atraumatic  Lungs: Unlabored respirations  Heart: Well perfused  Abdomen: Non-distended  Musculoskeletal: Moves all extremities  Neurological: Grossly intact      IMPRESSION:    1. Spondylosis of cervical region without myelopathy or radiculopathy    2. DDD (degenerative disc disease), cervical         PLAN: Bilateral C6-T1 Medial Branch Blocks

## 2022-05-11 NOTE — Progress Notes

## 2022-05-11 NOTE — Patient Instructions
Discharge Instructions for Medial Branch Block    Important information following your procedure today: You may drive today    This injection is for diagnostic purposes, it is a test. Only short-term results are expected.    Though the procedure is generally safe and complications are rare, we Rhapsody Wolven ask that you be aware of any of the following:   Any swelling, persistent redness, new bleeding, or drainage from the site of the injection.  You should not experience a severe headache.  You should not run a fever over 101 F.  New onset of sharp, severe back & or neck pain.  New onset of upper or lower extremity numbness or weakness.  New difficulty controlling bowel or bladder function after the injection.  New shortness of breath.    If any of these occur, please call to report this occurrence to Dr. Latif at [phone]. If you are calling after 4:00 p.m. or on weekends or holidays please call [phone] and ask to have the resident physician on call for the physician paged or go to your local emergency room.    Avoid application of direct heat, hot showers or hot tubs today.    Remain active today. Farley Crooker the activities that would normally cause you pain. After the injection, you should get at least 80% relief for up to 2-4 hours.    Call back to report the results to a nurse tomorrow at Dr. Latif at [phone]. You may have to leave a message. Give your name and contact information and answer the following questions.  Did you get relief?  Percentage of relief?  How long did it last?    If you are a candidate, a scheduler will call you within the next week to schedule your next procedure. The nurse will call you back if your results were not as expected to discuss next steps in your treatment plan.      The following medications were used: Bupivicaine        If you are unable to keep your upcoming appointment, please notify the [facility] scheduler at [phone] at least 24 hours in advance.

## 2022-05-11 NOTE — Procedures
Attending Surgeon: Evelina Bucy, MD    Anesthesia: Local    Pre-Procedure Diagnosis:   1. Spondylosis of cervical region without myelopathy or radiculopathy    2. DDD (degenerative disc disease), cervical        Post-Procedure Diagnosis:   1. Spondylosis of cervical region without myelopathy or radiculopathy    2. DDD (degenerative disc disease), cervical        Pain Score: Four    [place] AMB SPINE INJECT MBB CERV/THOR  Procedure: medial branch block    Laterality: bilateral    Location: - C5, C6 and C7      Consent:   Consent obtained: written  Consent given by: patient  Risks discussed: allergic reaction, bleeding, bruising, infection, no change or worsening in pain, reaction to medication, seizure, weakness and swelling  Alternatives discussed: alternative treatment, delayed treatment, no treatment and referral  Discussed with patient the purpose of the treatment/procedure, other ways of treating my condition, including no treatment/ procedure and the risks and benefits of the alternatives. Patient has decided to proceed with treatment/procedure.        Universal Protocol:  Relevant documents: relevant documents present and verified  Test results: test results available and properly labeled  Imaging studies: imaging studies available  Required items: required blood products, implants, devices, and special equipment available  Site marked: the operative site was marked  Patient identity confirmed: Patient identify confirmed verbally with patient.        Time out: Immediately prior to procedure a time out was called to verify the correct patient, procedure, equipment, support staff and site/side marked as required      Procedures Details:   Indications: pain and diagnostic evaluation   Prep: chlorhexidine  Patient position: prone  Number of Levels: 2  Guidance: fluoroscopy  Needle size: 25 G  Patient tolerance: Patient tolerated the procedure well with no immediate complications. Pressure was applied, and hemostasis was accomplished.  Comments:   Cervical Medial Branch Blocks with Fluoroscopy (AP Approach)    PROCEDURE:  1) C5-C7 bilateral medial branch blocks  2) Fluoroscopic needle guidance    REASON FOR PROCEDURE: Cervical spondylosis without myelopathy or radiculopathy, DDD (cervical), facet arthropathy    PHYSICIAN: Evelina Bucy, MD, MBA    MEDICATIONS INJECTED: 0.4 mL of 0.5% bupivacaine at each level    LOCAL ANESTHETIC INJECTED: Lidocaine 1%, 0.29ml at each site    SEDATION MEDICATIONS: None    ESTIMATED BLOOD LOSS: None    COMPLICATIONS: None    TECHNIQUE: Time-out was taken to identify the correct patient, procedure and side prior to starting the procedure. Lying in a prone position, the patient was prepped and draped in the usual sterile fashion using DuraPrep and a fenestrated drape. The level was determined under AP fluoroscopy.     The 25 gauge 3.5 Quincke needle was introduced to the anatomic location of the medial branch at the waist of each vertebral level utilizing intermittent fluoroscopy. Medication was then injected slowly following negative aspiration.    The procedure was completed without complications and was tolerated well. The patient was monitored after the procedure. The patient (or responsible party) was given post-procedure and discharge instructions to follow at home. The patient was discharged in stable condition.            Administrations This Visit     bupivacaine PF (MARCAINE) 0.5 % injection 3 mL     Admin Date  05/11/2022 Action  Given Dose  3 mL Route  Injection  Administered By  Cas Tracz, MD              Estimated blood loss: none or minimal  Specimens: none  Patient tolerated the procedure well with no immediate complications. Pressure was applied, and hemostasis was accomplished.

## 2022-05-14 ENCOUNTER — Encounter: Admit: 2022-05-14 | Discharge: 2022-05-14

## 2022-05-14 ENCOUNTER — Ambulatory Visit: Admit: 2022-05-14 | Discharge: 2022-05-14 | Payer: BC Managed Care – PPO

## 2022-05-14 DIAGNOSIS — R1011 Right upper quadrant pain: Secondary | ICD-10-CM

## 2022-05-14 DIAGNOSIS — K828 Other specified diseases of gallbladder: Secondary | ICD-10-CM

## 2022-05-14 MED ORDER — SINCALIDE 5 MCG IJ SOLR
.02 ug/kg | Freq: Once | INTRAVENOUS | 0 refills | Status: CP
Start: 2022-05-14 — End: ?
  Administered 2022-05-14: 14:00:00 1.88 ug via INTRAVENOUS

## 2022-05-14 MED ORDER — RP DX TC-99M MEBROFENIN MCI
5 | Freq: Once | INTRAVENOUS | 0 refills | Status: CP
Start: 2022-05-14 — End: ?
  Administered 2022-05-14: 13:00:00 5.4 via INTRAVENOUS

## 2022-05-15 ENCOUNTER — Encounter: Admit: 2022-05-15 | Discharge: 2022-05-15

## 2022-05-21 ENCOUNTER — Ambulatory Visit: Admit: 2022-05-21 | Discharge: 2022-05-22 | Payer: BC Managed Care – PPO

## 2022-05-21 ENCOUNTER — Encounter: Admit: 2022-05-21 | Discharge: 2022-05-21

## 2022-05-21 ENCOUNTER — Ambulatory Visit: Admit: 2022-05-21 | Discharge: 2022-05-21 | Payer: BC Managed Care – PPO

## 2022-05-21 VITALS — BP 118/77 | HR 78 | Temp 97.80000°F | Ht 69.016 in | Wt 208.4 lb

## 2022-05-21 DIAGNOSIS — K828 Other specified diseases of gallbladder: Secondary | ICD-10-CM

## 2022-05-21 DIAGNOSIS — R519 Generalized headaches: Secondary | ICD-10-CM

## 2022-05-21 DIAGNOSIS — N390 Urinary tract infection, site not specified: Secondary | ICD-10-CM

## 2022-05-21 DIAGNOSIS — M1990 Unspecified osteoarthritis, unspecified site: Secondary | ICD-10-CM

## 2022-05-21 DIAGNOSIS — R112 Nausea with vomiting, unspecified: Secondary | ICD-10-CM

## 2022-05-21 DIAGNOSIS — H269 Unspecified cataract: Secondary | ICD-10-CM

## 2022-05-21 DIAGNOSIS — M2550 Pain in unspecified joint: Secondary | ICD-10-CM

## 2022-05-21 DIAGNOSIS — H5440 Blindness, one eye, unspecified eye: Secondary | ICD-10-CM

## 2022-05-21 DIAGNOSIS — K929 Disease of digestive system, unspecified: Secondary | ICD-10-CM

## 2022-05-21 DIAGNOSIS — H3530 Unspecified macular degeneration: Secondary | ICD-10-CM

## 2022-05-21 DIAGNOSIS — E079 Disorder of thyroid, unspecified: Secondary | ICD-10-CM

## 2022-05-21 MED ORDER — CEFAZOLIN 1 GRAM IJ SOLR
2 g | Freq: Once | INTRAVENOUS | 0 refills
Start: 2022-05-21 — End: ?

## 2022-05-21 MED ORDER — INDOCYANINE GREEN 25 MG IJ SOLR
2.5 mg | Freq: Once | INTRAVENOUS | 0 refills
Start: 2022-05-21 — End: ?

## 2022-05-21 NOTE — Progress Notes
Date of Service: 05/21/2022    Subjective:             Frank Jenkins is a 66 y.o. male. from [facility]    History of Present Illness  That has been having right upper quadrant pain for some time. It is associated with meals. Radiates to right back.   He underwent HIDA scan showing a EF of 0. He was referred by Dr. Janene Madeira to discuss possible cholecystectomy.       Review of Systems   Constitutional: Positive for activity change and appetite change.   HENT: Positive for tinnitus.    Eyes: Negative.    Respiratory: Negative.    Cardiovascular: Negative.    Gastrointestinal: Positive for abdominal pain.   Endocrine: Negative.    Genitourinary: Negative.    Musculoskeletal: Positive for back pain.   Skin: Negative.    Allergic/Immunologic: Negative.    Neurological: Negative.    Hematological: Negative.    Psychiatric/Behavioral: Negative.          Objective:         ? acetaminophen (TYLENOL) 500 mg tablet Take one tablet by mouth every 6 hours as needed for Pain. Max of 4,000 mg of acetaminophen in 24 hours.   ? cyclobenzaprine (FLEXERIL) 5 mg tablet Take one tablet to two tablets by mouth twice daily as needed for Muscle Cramps.   ? duloxetine DR (CYMBALTA) 60 mg capsule Take 1 capsule by mouth once daily   ? famotidine (PEPCID) 40 mg tablet Take one tablet by mouth at bedtime daily.   ? finasteride (PROSCAR) 5 mg tablet Take one tablet by mouth daily.   ? levothyroxine (SYNTHROID) 50 mcg tablet Take one tablet by mouth daily 30 minutes before breakfast.   ? methyl salicylate/menthol (BENGAY TP) Apply  topically to affected area daily as needed.   ? MYRBETRIQ 50 mg ER tablet Take one tablet by mouth daily.   ? pantoprazole DR (PROTONIX) 40 mg tablet Take one tablet by mouth every morning. Indications: gastroesophageal reflux disease   ? predniSONE (DELTASONE) 50 mg tablet Take one tablet by mouth daily.   ? sod picosulf-mag ox-citric ac (CLENPIQ) 10 mg-3.5 gram- 12 gram/160 mL oral solution Split dose by mouth per MyChart portal prep instructions   ? tiZANidine (ZANAFLEX) 2 mg tablet Take one tablet by mouth daily.   ? vit C,E-Zn-coppr-lutein-zeaxan (PRESERVISION AREDS-2) 250-90-40-1 mg cap      Vitals:    05/21/22 1426   PainSc: One   Weight: 94.5 kg (208 lb 6.4 oz)   Height: 175.3 cm (5' 9.02)     Body mass index is 30.76 kg/m?Marland Kitchen     Physical Exam  Constitutional:       Appearance: He is well-developed.   HENT:      Head: Normocephalic.   Eyes:      Conjunctiva/sclera: Conjunctivae normal.   Neck:      Vascular: No JVD.   Cardiovascular:      Rate and Rhythm: Normal rate and regular rhythm.   Pulmonary:      Effort: Pulmonary effort is normal.      Breath sounds: Normal breath sounds.   Abdominal:      General: Bowel sounds are normal.      Palpations: Abdomen is soft.   Musculoskeletal:         General: Normal range of motion.      Cervical back: Neck supple.   Skin:     General:  Skin is warm and dry.   Neurological:      Mental Status: He is alert and oriented to person, place, and time.     abdominal distention         Assessment and Plan:  66 y.o. male with abdominal distention, pain, and biliary dyskinesia. We discussed options. Risks vs benefits of laparoscopic cholecystectomy was discussed in detail including possible bile duct leak, injury, and conversion to open operation. We are going to initiate metamucil for abdominal distention. Plan surgery as an outpatient.

## 2022-05-21 NOTE — Patient Instructions
Surgery: Friday, August 4th with Dr. Flo Shanks    Your surgery will be at the [facility].      Advanced care, conveniently located         Address  [address]  [place], [place] [address]   [phone]    Directions  Located close to [facility], the [address] is just [place] of [address] and [place] in [place], [place]. The main entrance to the hospital is on the south side of the building.    Helpful driving directions  From [id number]  Take [employer] from [phone].    Go north to [employer].   Turn left (west) approximately 1/10 of a mile.   Turn left (south) into the [address] (formerly [employer]).   At stop sign, turn left. Then take the second right (this will be the right turn after the speed bump).    The main entrance is located on the south side of the building. Park in any available space.   Parking  Plentiful complimentary parking is just steps away from the front door, located on the south side of the building. Convenient complimentary parking for [facility] patients is located at that facility.    You will need to check into the kiosk at the main entrance  You will be called up to the main desk for registration.   You will receive a call from the surgical team 1-3 days prior to your surgery, to inform you of what time you will need to arrive the day of your surgery.    -Do not eat or drink (including gum, mints, candy, or chewing tobacco) anything after midnight-12 am the night before your surgery.  -The morning of your surgery brush your teeth and tongue, ok to rinse, but do not drink any water.  -You may take your medications with a sip of water if instructed by your physician to do so.  -Please do not take diabetic medication the morning of your procedure unless otherwise instructed.   -If you are taking blood thinning medications such as Warafin/Coumadin, Clopidogrel/Plavix, or aspirins, please stop taking as instructed prior to the procedure if authorized by your prescribing physician.   -You will need to have someone drive you home.       Please call with any questions or concerns.  Scheduling Bridgett Larsson, [facility] [place]) [phone]  Nurse line Dahlia Client, [facility]) [phone]  Fax [phone]             Laparoscopic Cholecystectomy   Laparoscopic Cholecystectomy is a procedure in which the gallbladder is removed by laparoscopic technique. Small incisions are made and ports are placed through these incisions. A camera and instruments are placed through these ports in order to perform the surgery. The gallbladder is removed through one of the incision sites.    Post operative activity:  A responsible adult must drive you home.  After receiving sedation or anesthesia, you should not drive, operate heavy machinery or do anything that requires concentration for at least 24 hours after receiving sedation.  We recommend that you do NOT do any strenuous activity or heavy lifting until your first post-operative appointment.- typically 2 weeks post surgery date.    Post-operative site care:  Okay to shower and get the incision sites wet after 24 hours, do not submerge in water.  Expect to feel a ?bloated? feel to last 1-2 weeks after surgery, sometimes this  can cause pain in your shoulders.    Diet:  Immediately after surgery, you may begin with liquids and advance diet slowly.  In the event of stomach upset or loose bowel movements, please remove fatty foods from your diet for 2 weeks.    Home medications:  You may resume your home medications, unless the doctor has specifically asked that you stop a certain medication.    Pain management:  Pain medication will be prescribed during the post-operative period. Please take medication as prescribed. Do not take pain medication on an empty stomach.  Please do not take your pain medication scripts to [facility]  You may use a heating pad, avoid direct contact to the skin.  No driving or operating heavy machinery while taking  pain medication.  Avoid alcohol while taking pain medication.  Pain medication can cause constipation, begin taking senokot (over the counter) when taking pain medication,if no bowel movement in 48 hrs post surgery, take senokot and milk of magnesia until daily bowel movement.  You may take over the counter pain relieving medication as needed for break through pain.    If you choose not to take the prescribed pain medication, please use over the counter medications for pain control.    For over the counter medications, please use Tylenol (Acetaminophen) or Ibuprofen.   It is not uncommon to have pain that radiates to your shoulder(s) after surgery, please walk to help relieve this discomfort    Please notify the nurse with the following symptoms: [phone]  If pain in not controlled with pain medication.  No bowel movement in 4 days.  Signs and symptoms of infection  Chills, body aches, fever greater than 101.5  Redness, swelling, warmth around incision sites  Purulent drainage from incision sites    If you feel you are having an EMERGENCY, please call 911 or go to your nearest emergency room.

## 2022-05-22 ENCOUNTER — Encounter: Admit: 2022-05-22 | Discharge: 2022-05-22

## 2022-05-22 DIAGNOSIS — H269 Unspecified cataract: Secondary | ICD-10-CM

## 2022-05-22 DIAGNOSIS — R112 Nausea with vomiting, unspecified: Secondary | ICD-10-CM

## 2022-05-22 DIAGNOSIS — E079 Disorder of thyroid, unspecified: Secondary | ICD-10-CM

## 2022-05-22 DIAGNOSIS — M1990 Unspecified osteoarthritis, unspecified site: Secondary | ICD-10-CM

## 2022-05-22 DIAGNOSIS — R519 Generalized headaches: Secondary | ICD-10-CM

## 2022-05-22 DIAGNOSIS — M2550 Pain in unspecified joint: Secondary | ICD-10-CM

## 2022-05-22 DIAGNOSIS — K828 Other specified diseases of gallbladder: Principal | ICD-10-CM

## 2022-05-22 DIAGNOSIS — K929 Disease of digestive system, unspecified: Secondary | ICD-10-CM

## 2022-05-22 DIAGNOSIS — H3530 Unspecified macular degeneration: Secondary | ICD-10-CM

## 2022-05-22 DIAGNOSIS — H5440 Blindness, one eye, unspecified eye: Secondary | ICD-10-CM

## 2022-05-22 DIAGNOSIS — N390 Urinary tract infection, site not specified: Secondary | ICD-10-CM

## 2022-05-25 ENCOUNTER — Encounter: Admit: 2022-05-25 | Discharge: 2022-05-25

## 2022-05-28 ENCOUNTER — Encounter: Admit: 2022-05-28 | Discharge: 2022-05-28

## 2022-05-28 ENCOUNTER — Ambulatory Visit: Admit: 2022-05-28 | Discharge: 2022-05-28

## 2022-05-28 ENCOUNTER — Ambulatory Visit: Admit: 2022-05-28 | Discharge: 2022-05-28 | Payer: BC Managed Care – PPO

## 2022-05-28 DIAGNOSIS — R519 Generalized headaches: Secondary | ICD-10-CM

## 2022-05-28 DIAGNOSIS — R112 Nausea with vomiting, unspecified: Secondary | ICD-10-CM

## 2022-05-28 DIAGNOSIS — H269 Unspecified cataract: Secondary | ICD-10-CM

## 2022-05-28 DIAGNOSIS — N390 Urinary tract infection, site not specified: Secondary | ICD-10-CM

## 2022-05-28 DIAGNOSIS — H5440 Blindness, one eye, unspecified eye: Secondary | ICD-10-CM

## 2022-05-28 DIAGNOSIS — M47812 Spondylosis without myelopathy or radiculopathy, cervical region: Secondary | ICD-10-CM

## 2022-05-28 DIAGNOSIS — E079 Disorder of thyroid, unspecified: Secondary | ICD-10-CM

## 2022-05-28 DIAGNOSIS — M2550 Pain in unspecified joint: Secondary | ICD-10-CM

## 2022-05-28 DIAGNOSIS — M1990 Unspecified osteoarthritis, unspecified site: Secondary | ICD-10-CM

## 2022-05-28 DIAGNOSIS — H3530 Unspecified macular degeneration: Secondary | ICD-10-CM

## 2022-05-28 DIAGNOSIS — K929 Disease of digestive system, unspecified: Secondary | ICD-10-CM

## 2022-05-28 MED ORDER — DEXAMETHASONE SODIUM PHOS (PF) 10 MG/ML IJ EPIDURAL SOLN
10 mg | Freq: Once | EPIDURAL | 0 refills | Status: CP
Start: 2022-05-28 — End: ?

## 2022-05-28 MED ORDER — MIDAZOLAM 1 MG/ML IJ SOLN
2 mg | INTRAVENOUS | 0 refills | Status: AC | PRN
Start: 2022-05-28 — End: ?

## 2022-05-28 MED ORDER — LIDOCAINE (PF) 10 MG/ML (1 %) IJ SOLN
10 mL | Freq: Once | INTRAMUSCULAR | 0 refills | Status: CP
Start: 2022-05-28 — End: ?

## 2022-05-28 MED ORDER — LIDOCAINE (PF) 20 MG/ML (2 %) IJ SOLN
5 mL | Freq: Once | INTRAMUSCULAR | 0 refills | Status: CP
Start: 2022-05-28 — End: ?

## 2022-05-28 MED ORDER — FENTANYL CITRATE (PF) 50 MCG/ML IJ SOLN
50-100 ug | INTRAVENOUS | 0 refills | Status: AC | PRN
Start: 2022-05-28 — End: ?

## 2022-05-28 NOTE — Progress Notes
SPINE CENTER  INTERVENTIONAL PAIN PROCEDURE HISTORY AND PHYSICAL    Chief Complaint: Pain    HISTORY OF PRESENT ILLNESS:  Frank Jenkins presents today for interventional treatment of his pain. He denies any fevers, chills, infection, or current use of contraindicated anticoagulants. Risks of the procedure were discussed including but not limited to bleeding, infection, damage to surrounding structures and reaction to medications. He reports understanding and has elected to proceed with the procedure.    Bilateral C6-T1 MBB #1 with at least 80% relief.       Medical History:   Diagnosis Date   ? Arthritis    ? Blind painful right eye    ? Cataract    ? Disorder of thyroid gland     hypothyroidectomy   ? Gastrointestinal disorder     GERD   ? Generalized headaches    ? Joint pain 1995    Neck, back, knees, shoulder   ? Macular degeneration     left eye   ? PONV (postoperative nausea and vomiting)    ? UTI (urinary tract infection)     hospitalized in february       Surgical History:   Procedure Laterality Date   ? HX EYE SURGERY Right 1995    vitrectomy   ? HX EYE SURGERY Right 1995    retinal reattachment   ? HX EYE SURGERY Right 1997    Gas bubble   ? HX EYE SURGERY Left 1998    retinal reattachment   ? HERNIA REPAIR Bilateral 2009    inguinal   ? HX CATARACT REMOVAL Right 2013   ? LEFT LUMBAR 4-5 DISCECTOMY Left 03/08/2018    Performed by Emogene Morgan, MD at [facility] OR   ? ESOPHAGOGASTRODUODENOSCOPY WITH BIOPSY - FLEXIBLE N/A 09/23/2020    Performed by Buckles, Vinnie Level, MD at [facility] OR   ? Colonoscopy N/A 03/10/2022    Performed by Tempie Hoist, DO at [facility] OR   ? ESOPHAGOGASTRODUODENOSCOPY WITH BIOPSY - FLEXIBLE N/A 03/10/2022    Performed by Tempie Hoist, DO at [facility] OR       family history includes Cancer in his brother, brother, father, sister, sister, and sister; Cataract in his mother; Hyperlipidemia in his mother; Hypertension in his mother; Thyroid Disease in his mother.    Social History     Socioeconomic History   ? Marital status: Married   Tobacco Use   ? Smoking status: Former     Packs/day: 0.25     Years: 4.00     Pack years: 1.00     Types: Cigarettes     Quit date: 01/15/1979     Years since quitting: 43.3   ? Smokeless tobacco: Never   Substance and Sexual Activity   ? Alcohol use: Yes     Alcohol/week: 1.0 - 2.0 standard drink of alcohol     Types: 1 - 2 Cans of beer per week     Comment: Socially   ? Drug use: No   ? Sexual activity: Yes     Partners: Female     Birth control/protection: Post-menopausal       Allergies   Allergen Reactions   ? Albuterol RASH and EDEMA   ? Aspirin ANAPHYLAXIS and EDEMA   ? Ibuprofen ANAPHYLAXIS   ? Ketorolac ANAPHYLAXIS   ? Nsaids (Non-Steroidal Anti-Inflammatory Drug) ANAPHYLAXIS       Vitals:    05/11/22 1537   BP: 128/79  BP Source: Arm, Right Upper   Pulse: 82   Temp: 36.7 ?C (98.1 ?F)   Resp: 16   SpO2: 98%   TempSrc: Oral   PainSc: Four   Weight: 93.9 kg (207 lb)   Height: 175.3 cm (5' 9)       REVIEW OF SYSTEMS: 10 point ROS obtained and negative except pain    PHYSICAL EXAM:  General: Alert, cooperative, no distress  Head: Normocephalic, atraumatic  Lungs: Unlabored respirations  Heart: Well perfused  Abdomen: Non-distended  Musculoskeletal: Moves all extremities  Neurological: Grossly intact      IMPRESSION:    1. Spondylosis of cervical region without myelopathy or radiculopathy    2. DDD (degenerative disc disease), cervical         PLAN: Bilateral C6-T1 RFA    At least 80% relief with Bilateral C6-T1 MBB x2    Axial/Facetogenic pain >3 mos  Failed to improve with conservative treatment including pharmacologic, passage of time, and exercise.   Imaging consistent with pain.   Other diagnoses ruled out.   Baseline Oswestry   30      General Pre Procedural Sedation ASA Classification    I have discussed risks and alternatives of this type of sedation and procedure with the patient (and other designee if appropriate).    Patient has been NPO for 6 hours as per protocol.     Pregnancy Status: Not applicable    Prior Anesthetic Types: Anxiolysis    Airway: Airway assessment performed II (soft palate, uvula, fauces visible)    Head and Neck: No abnormalities noted    Mouth: No abnormalities noted    Medications for Procedural Sedation: Versed and/or Fentanyl    Anesthesia Classification:  ASA II (A normal patient with mild systemic disease)    Patient remains a candidate for procedure: Yes    The intention for the procedure today is Anxiolysis/Analgesia.

## 2022-05-28 NOTE — Discharge Instructions - Supplementary Instructions
RADIOFREQUENCY ABLATION   DISCHARGE INSTRUCTIONS    Go directly home and rest. DO NOT drive today.  If you have a dressing or bandage on, you may remove it in 12 hours. You may shower tomorrow.   Apply ice to the procedure site at 20-minute intervals frequently for the next 24 hours.  If you had an IV for your procedure and a lump or redness occurs at the site, you may apply a warm, moist compress for 10minutes four times daily for 2-3 days.  If you had sedation for your procedure:  The anesthetic may make you drowsy and slow to react for up to 12 hours.  For the next 12 hours do not consume alcohol, drive, operate machinery, sign legal documents, or work.  Rest at home today.  A responsible adult needs to stay with you today and overnight.  Start with clear liquids and advance as tolerated.  Resume all previous medication unless directed not to.  It is not uncommon to experience an increase in pain for several days after the procedure.  Some people may experience a sunburn-like sensation for a week in the area of the ablation.  If it becomes bothersome despite over the counter NSAIDs (if you can take these), call for further advice.  Ease back into your activities and daily routine as tolerated over the next 2-3 days.  The beneficial effects from the radiofrequency procedure may take several weeks to be noticed.  There should be minimal drainage and no swelling or redness at the injection sites. You should not experience a severe headache. You should not run a fever over 101F. If any of these occur, call to report this to a nurse at [phone]. If you are calling after 4PM or on weekends/holidays, call [phone] and ask to have the on-call pain management resident paged or go to your local emergency room.  Follow up appointment as needed.     If you have questions for the surgery center, call [facility] at [phone].

## 2022-05-28 NOTE — Procedures
Attending Surgeon: Evelina Bucy, MD    Anesthesia: Local    Pre-Procedure Diagnosis:   1. Spondylosis of cervical region without myelopathy or radiculopathy        Post-Procedure Diagnosis:   1. Spondylosis of cervical region without myelopathy or radiculopathy             DESTRUCTION OF NERVE W/FLUORO CERVICAL/THORACIC    Laterality: bilateral    Location: Cervical/Thoracic -  C5, C6 and C7      Consent:   Consent obtained: written  Consent given by: patient  Risks discussed: bleeding, infection, nerve damage, no change or worsening in pain, weakness, sensation loss, reaction to medication and seizure  Alternatives discussed: alternative treatment, delayed treatment and no treatment  Discussed with patient the purpose of the treatment/procedure, other ways of treating my condition, including no treatment/ procedure and the risks and benefits of the alternatives. Patient has decided to proceed with treatment/procedure.        Universal Protocol:  Relevant documents: relevant documents present and verified  Test results: test results available and properly labeled  Imaging studies: imaging studies available  Required items: required blood products, implants, devices, and special equipment available  Site marked: the operative site was marked  Patient identity confirmed: Patient identify confirmed verbally with patient.        Time out: Immediately prior to procedure a time out was called to verify the correct patient, procedure, equipment, support staff and site/side marked as required      Procedures Details:     Prep: chlorhexidine    Sedation: anxiolysisPatient position: prone  Estimated Blood Loss: minimal  Specimens: none  Number of Levels Ablated: 2  Guidance: fluoroscopy  Needle size: 18 G  Active Needle Tip Length: 10mm  Neurolytic Technique: Radiofrequency Ablation      Patient tolerance: Patient tolerated the procedure well with no immediate complications. Pressure was applied, and hemostasis was accomplished.  Comments:   CERVICAL MEDIAL BRANCH RADIOFREQUENCY ABLATION UNDER FLUOROSCOPY    PROCEDURE:  1) bilateral C5-7 medial branch radiofrequency ablation (targeting the C6-7 and C7-T1 joints)  2) Fluoroscopic needle guidance    REASON FOR PROCEDURE: Cervical spondylosis without myleopathy or radiculopathy, facet arthropathy, DDD (cervical)    PHYSICIAN: Evelina Bucy, MD    MEDICATIONS INJECTED: 0.5 mL of 2% lidocaine with 1.66mg  dexamethasone was injected at each level.     LOCAL ANESTHETIC INJECTED: 1 mL of 1% lidocaine per site    SEDATION MEDICATIONS: Versed and Fentanyl as per nurse charting.    ESTIMATED BLOOD LOSS: None    SPECIMENS REMOVED: None    COMPLICATIONS: None    TECHNIQUE: Time-out was taken to identify the correct patient, procedure and side prior to starting the procedure. Lying in a prone position, the patient was prepped and draped in the usual sterile fashion using DuraPrep and a fenestrated drape. The levels were determined under fluoroscopy. Local anesthetic was given by raising a skin wheal and going down to the hub of a 27-gauge 1.25-inch needle. A 10mm tip radiofrequency needle was introduced to the anatomic location of the medial branch at the lateral mass of each cervical level utilizing intermittent fluoroscopy. Motor stimulation up to 1.5 volts was done to confirm no ablation of the ventral ramus at each level. 0.5 mL of 2% lidocaine was then injected slowly at each level. After waiting 60 seconds, ablation was performed utilizing a radiofrequency generator at 80 degrees C for 60 seconds.     The procedure was completed  without complications and was tolerated well. The patient was monitored after the procedure. The patient (or responsible party) was given post-procedure and discharge instructions to follow at home. The patient was discharged in stable condition.     Evelina Bucy, MD              Radiofrequency time 60  Radiofrequency Temperature 80        Administrations This Visit     dexamethasone PF (DECADRON) epidural injection 10 mg     Admin Date  05/28/2022 Action  Given Dose  10 mg Route  Epidural Administered By  Evelina Bucy, MD          fentaNYL citrate PF (SUBLIMAZE) injection 50-100 mcg     Admin Date  05/28/2022 Action  Given Dose  50 mcg Route  Intravenous Administered By  Pricilla Riffle, RN          lidocaine (PF) 20 mg/mL (2 %) injection 100 mg     Admin Date  05/28/2022 Action  Given Dose  100 mg Route  Injection Administered By  Evelina Bucy, MD          lidocaine PF 1% (10 mg/mL) injection 10 mL     Admin Date  05/28/2022 Action  Given Dose  10 mL Route  Injection Administered By  Evelina Bucy, MD          midazolam (VERSED) injection 2 mg     Admin Date  05/28/2022 Action  Given Dose  2 mg Route  Intravenous Administered By  Pricilla Riffle, RN              Estimated blood loss: none or minimal  Specimens: none  Patient tolerated the procedure well with no immediate complications. Pressure was applied, and hemostasis was accomplished.

## 2022-06-01 IMAGING — US ECHOCOMPL
1 series · 13 of 24 positions shown · non-contrast
Comparison: none

[Series 1: us echo 2d, complete · 58 acquisitions, 13 frames shown]
[im 1/58]
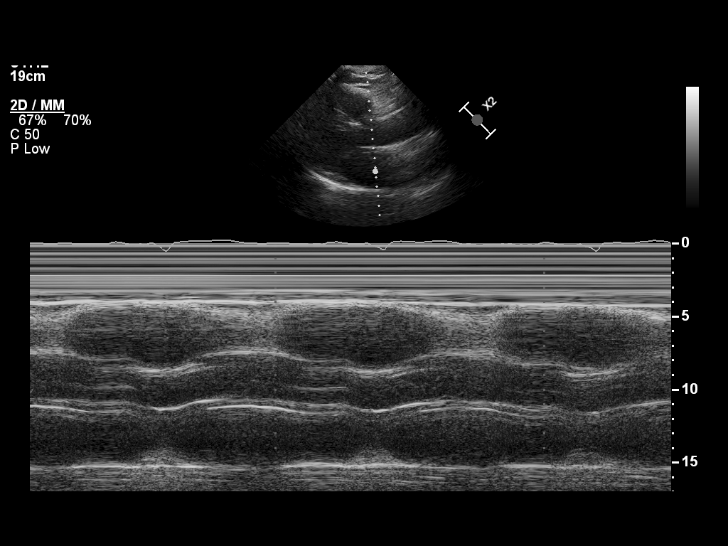
[im 5/58]
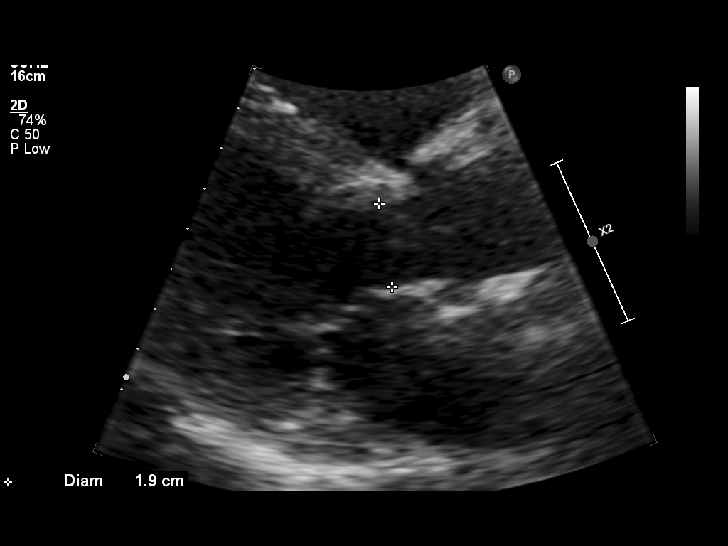
[im 10/58]
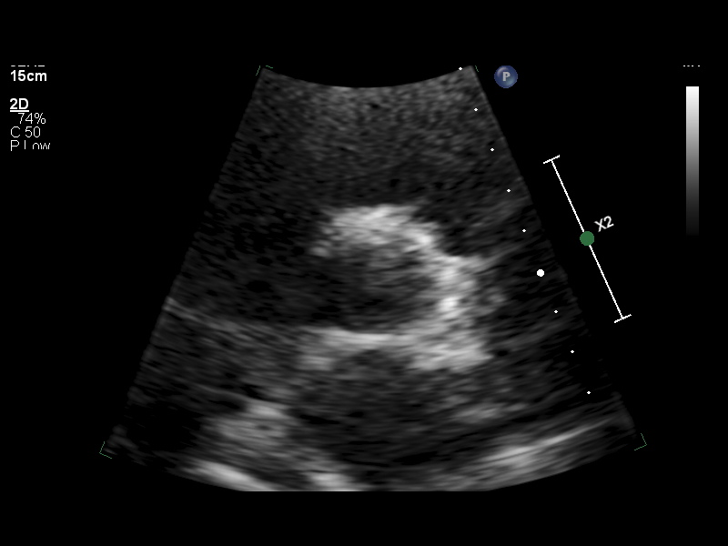
[im 15/58]
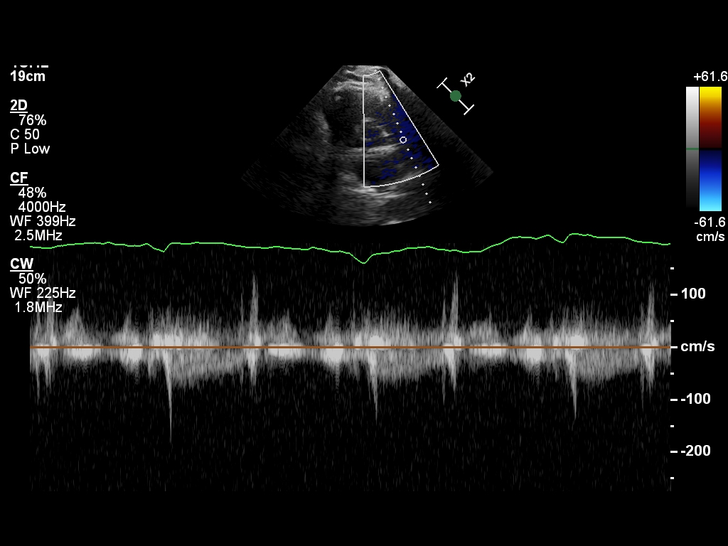
[im 20/58]
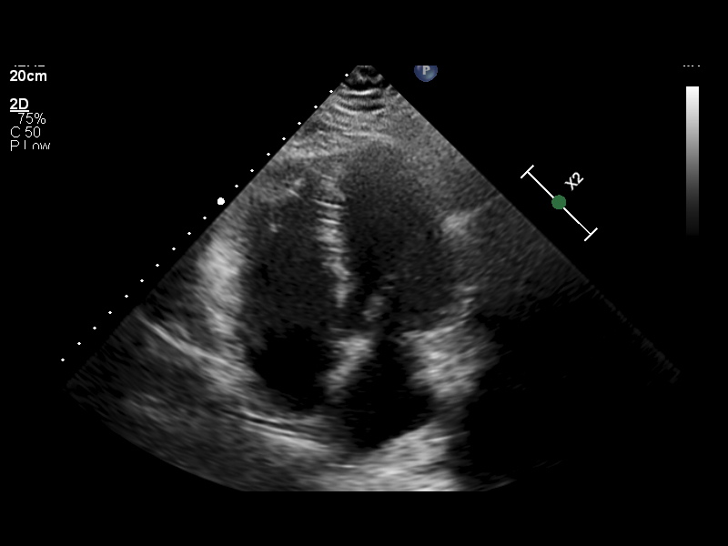
[im 28/58]
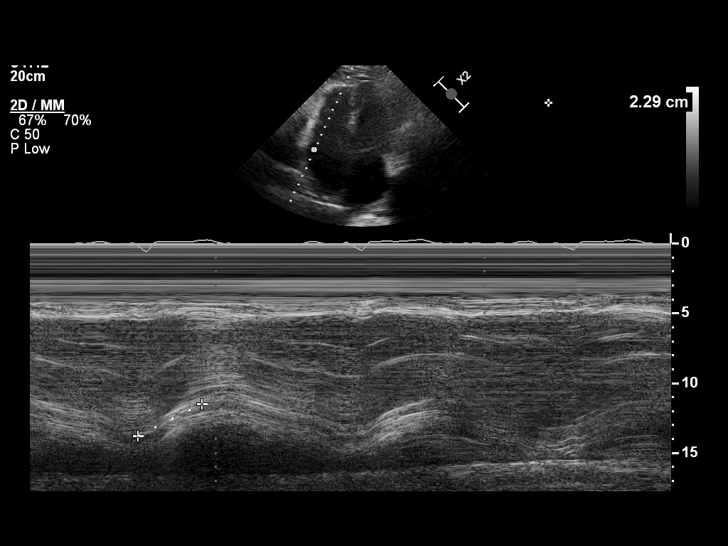
[im 33/58]
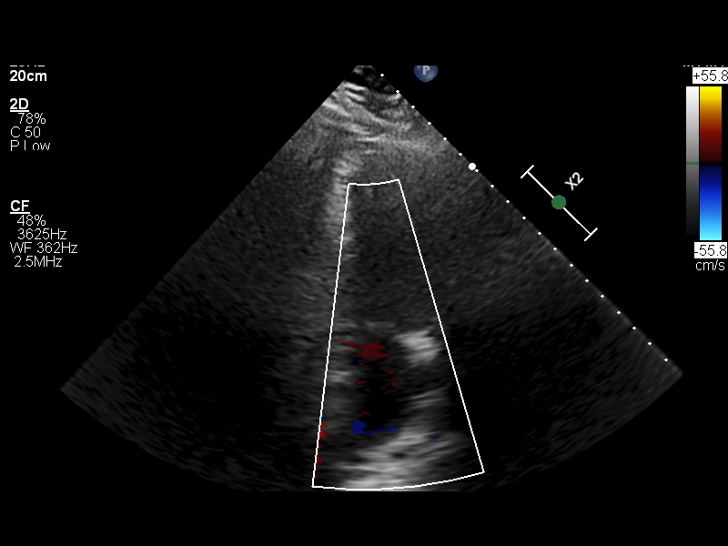
[im 35/58]
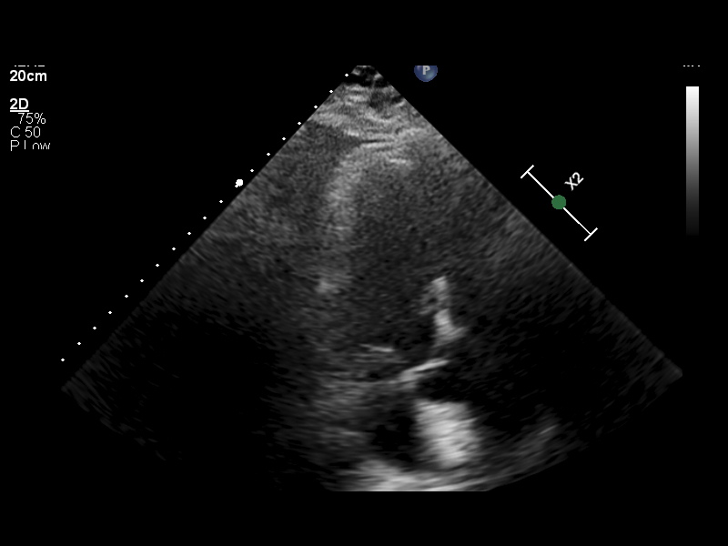
[im 40/58]
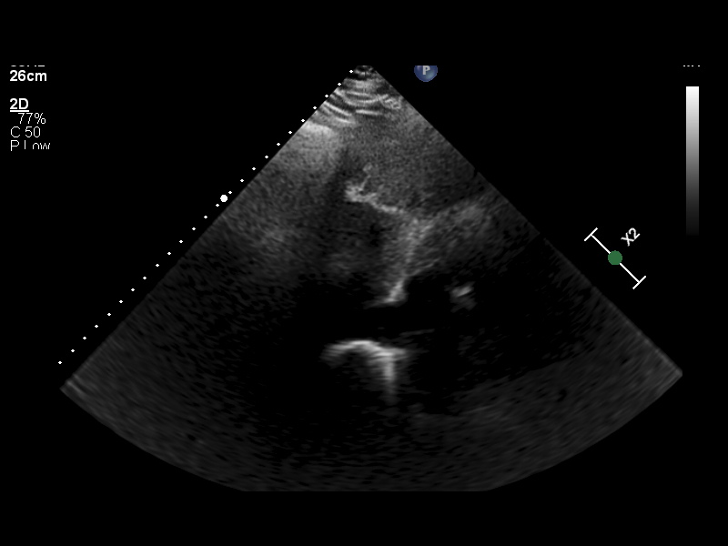
[im 45/58]
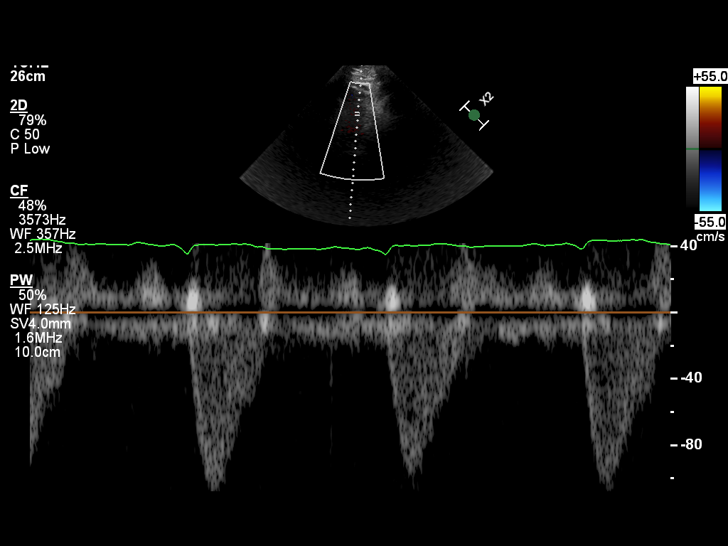
[im 50/58]
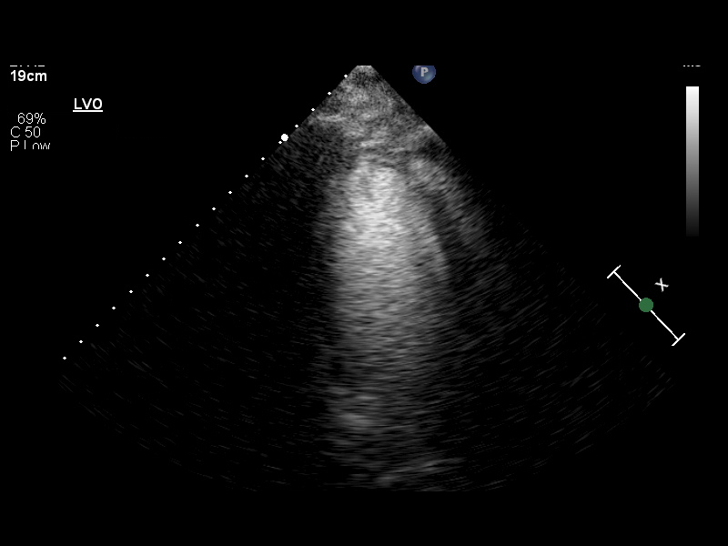
[im 53/58]
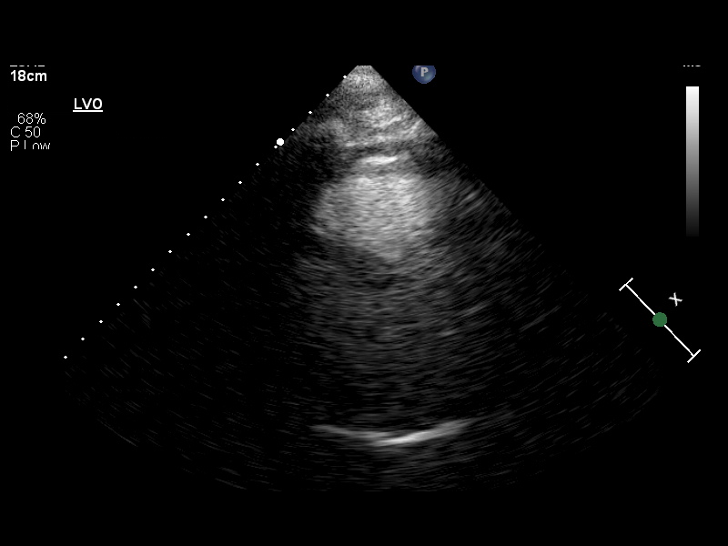
[im 58/58]
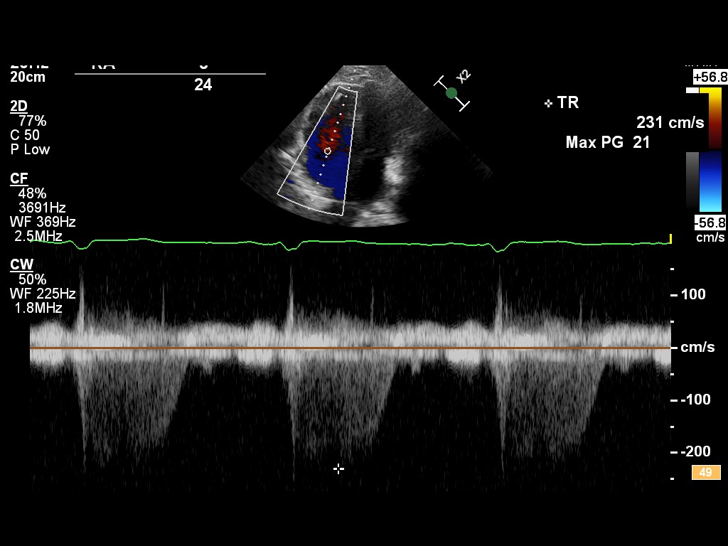

[13 of 24 positions shown; findings below may reference images not displayed]

01/27/21 -  2D + DOPPLER ECHO
Location Performed: [HOSPITAL]

Referring Provider:
Fellow:
Location of Interp:
Sonographer: External Staff
Machine:  [employer]

Indications:           Fever.

Definity contrast was given to enhance imaging.

Vitals
Height   Weight   BSA (Calculated)   BP   Comments
177.8 cm (5' 10")   94.8 kg (209 lb)   2.16   124/72

Interpretation Summary
Normal left ventricular systolic function, estimated ejection fraction is 60%.
Normal left ventricular diastolic function. Normal left atrial pressure.
The right ventricle is mildly dilated with preserved systolic function.
No significant valvular abnormalities.
Pulmonary artery pressure could not be calculated by this study due to incomplete TR signal.

Echocardiographic Findings
Left Ventricle   The left ventricular size is normal. Technically difficult study; i.v.
transpulmonary contrast was used to define the endocardial borders. Normal left ventricular systolic
function, estimated ejection fraction is  60%.  Normal left ventricular diastolic function. Normal
left atrial pressure.
Right Ventricle   The right ventricle is mildly dilated with preserved systolic function.
Left Atrium   Normal size.
Right Atrium   Mildly dilated.
IVC/SVC   Normal central venous pressure (0-5 mm Hg).
Mitral Valve   Normal valve structure. No stenosis. No regurgitation.
Tricuspid Valve   Normal valve structure. No stenosis. Trace regurgitation.
Aortic Valve   The aortic valve was not well seen. No stenosis.
Pulmonary   The pulmonic valve was not well seen. No regurgitation.
The pulmonary artery was not well seen.
Aorta   Normal aorta. The aortic root and ascending aorta are normal in size.
Pericardium   No pericardial effusion.

Left Heart 2D Measurements (Normal Ranges)
LVIDD
5.2 cm (Range: 4.2 - 5.8)
LVIDS
2.9 cm (Range: 2.5 - 4.0)
IVS
1.2 cm (Range: 0.6 - 1.0)
LV PW
1.3 cm (Range: 0.6 - 1.0)
LA Size
4.2 cm (Range: 3.0 - 4.0)

Right Heart 2D   M-Mode Measurements (Normal Ranges) (Range)
RV Basal Dia
4.3 cm (2.5 - 4.1)
RV Mid Dia
4.2 cm (1.9 - 3.5)
WAITOTO
20.3 cm2 (<18)
M-Mode TAPSE
2.3 cm (>1.7)

Left Heart 2D Addnl Measurements (Normal Ranges)
LV Systolic Vol
53 mL (Range: 21 - 61)
LV Systolic Vol Index
25 mL (Range: 11 - 31)
LV Diastolic Vol
132 mL (Range: 62 - 150)
LV Diastolic Vol Index
61 mL (Range: 34 - 74)
LA Vol
29 mL (Range: 18 - 58)
LA Vol Index
13.43 (Range: 16 - 34)
LV Mass
263 g (Range: 88 - 224)
LV Mass Index
122 g/m2 (Range: 49 - 115)
RWT
0.50 (Range: <=0.42)

Aortic Root Measurements (Normal Ranges)
Sinus
3.7 cm (Range: 2.8 - 4.0)
AYUKI BAUI
3.5 cm

Doppler (Spectral and Color Flow)
Aortic valve mean gradient
Aortic valve peak gradient
Aortic valve peak velocity
1.5 m/s
Aortic valve velocity ratio

Tech Notes:

Definity 1023. Reviewed allergies. JL

## 2022-06-01 IMAGING — CT NECK WO(Adult)
4 of 6 series · 14 of 34 positions shown, 16 images · non-contrast
Comparison: none

[Series 2: neck ax 3.00 br40 s3 · axial · 0.48mm/px · z∈[-713,-500]mm · 5 of 107 slices shown, 7 images (1 of 2)]
[im 18/107  soft-tissue]
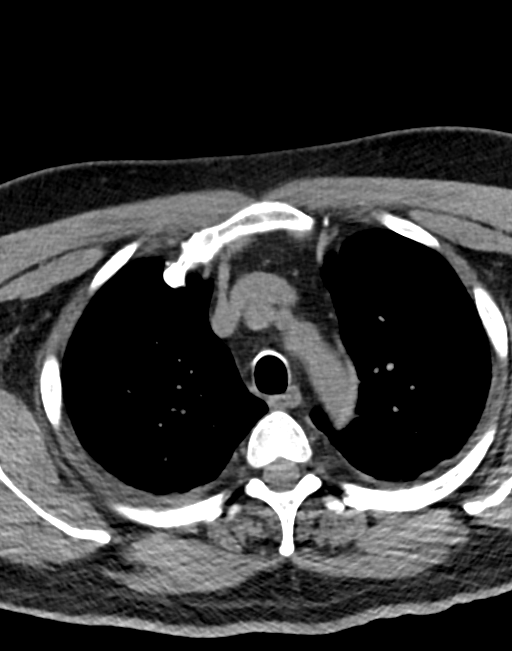
[im 18/107  bone]
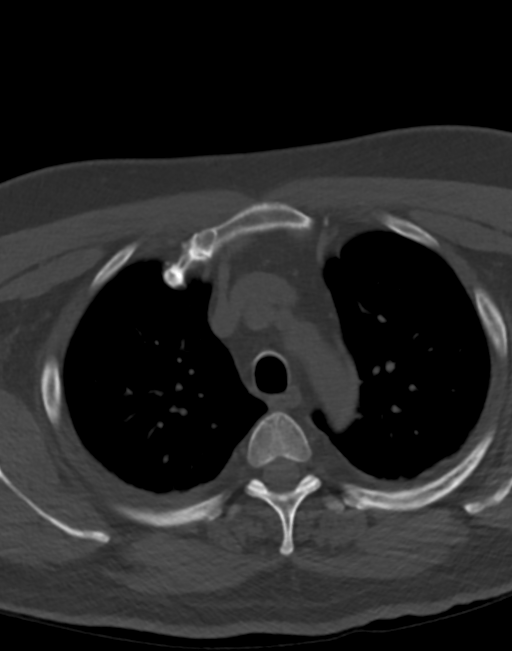
[im 36/107  bone]
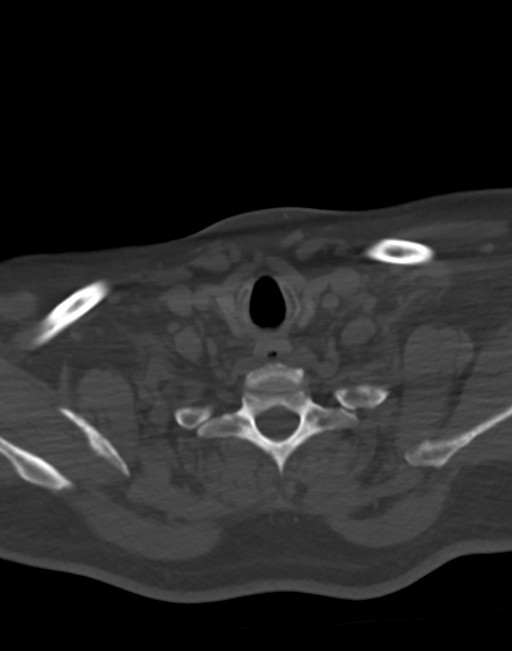
[im 54/107  bone]
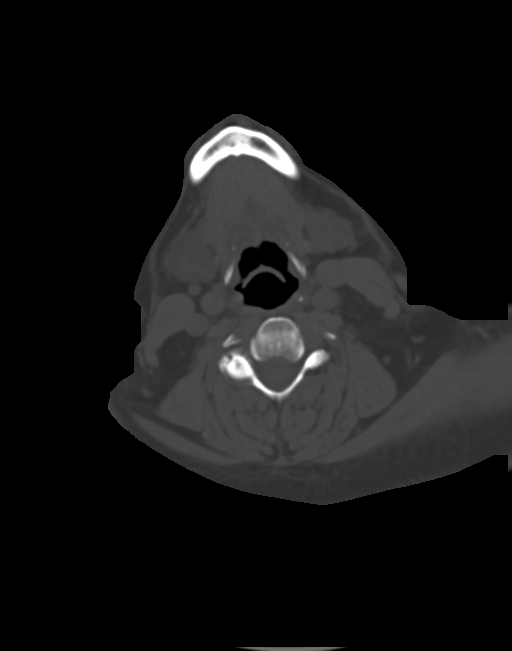
[im 71/107  bone]
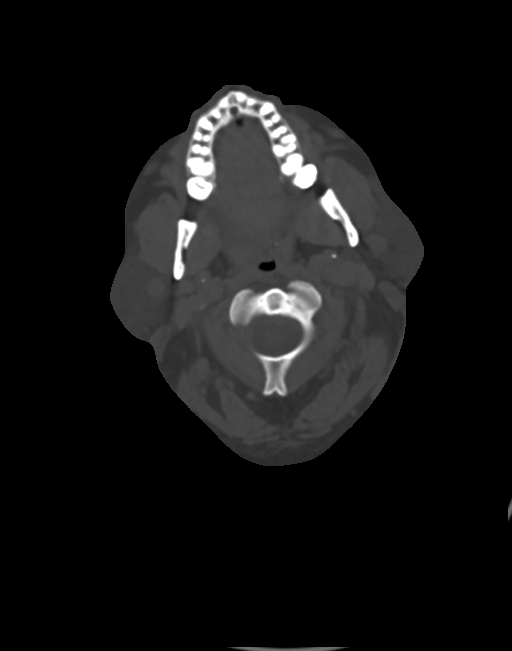
[im 89/107  soft-tissue]
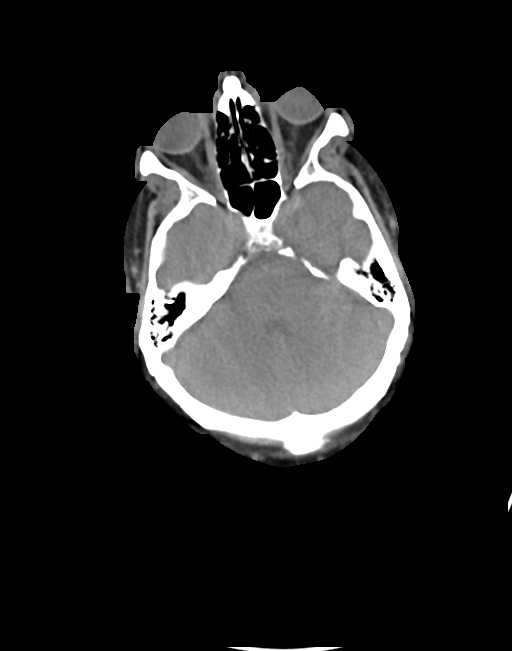
[im 89/107  bone]
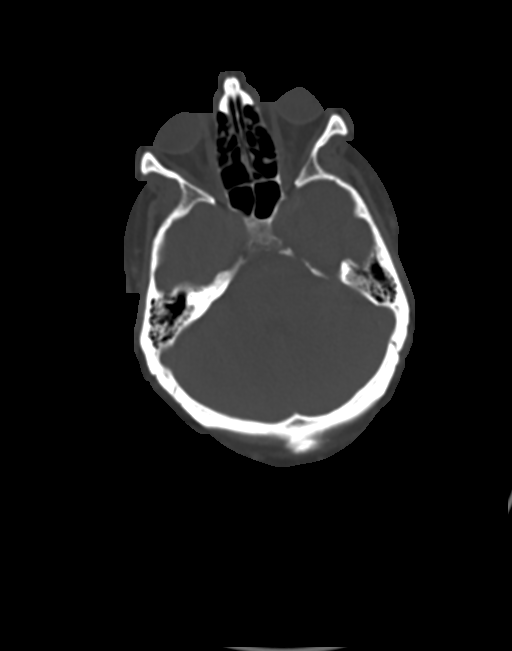

[Series 4: neck cor 3.00 br40 s3 · coronal · 0.48mm/px · 1 of 104 slices shown]
[im 52/104  bone]
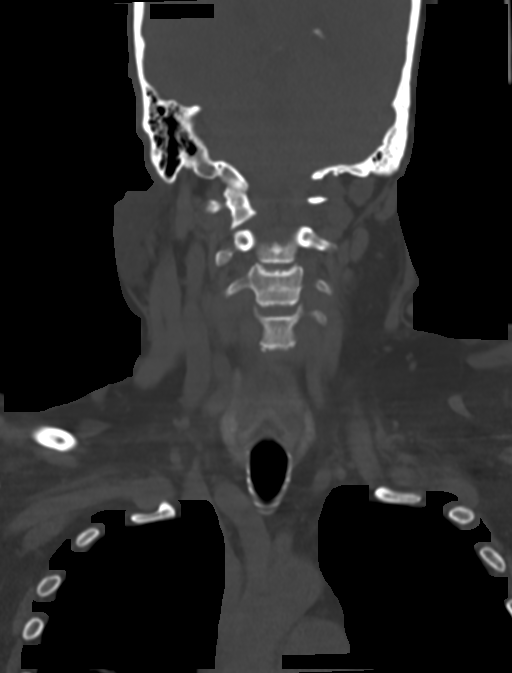

[Series 8: neck ax 3.00 br40 s3 · axial · 0.53mm/px · z∈[-687,-639]mm · 2 of 99 slices shown (2 of 2)]
[im 17/99  bone]
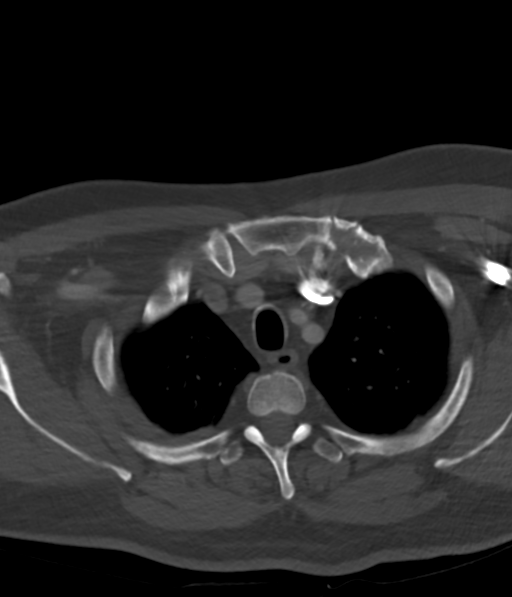
[im 33/99  bone]
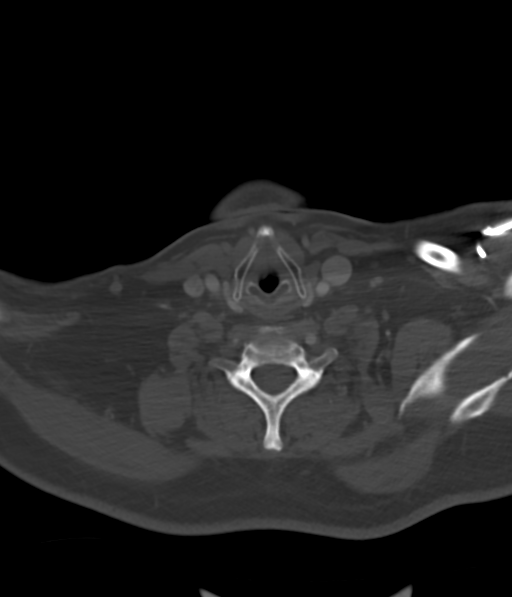

[Series 12: neck sag 3.00 br40 s3 · sagittal · 0.58mm/px · 6 of 90 slices shown]
[im 15/90  bone]
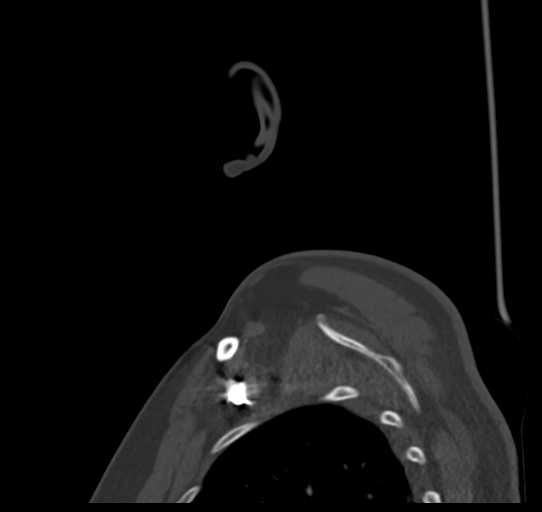
[im 20/90  soft-tissue]
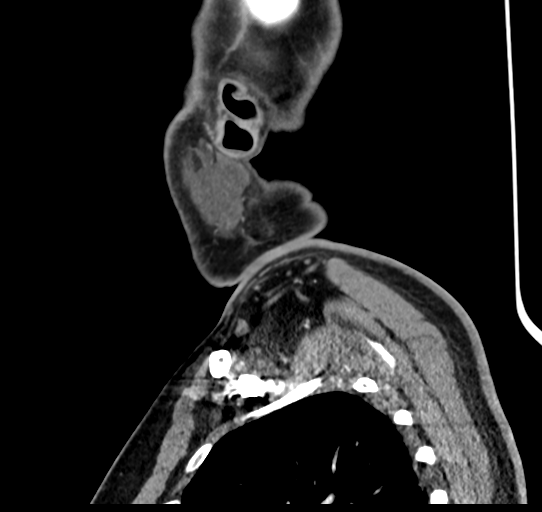
[im 30/90  bone]
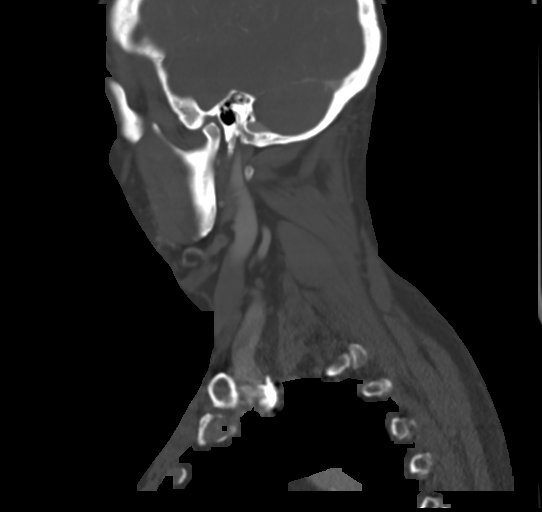
[im 45/90  bone]
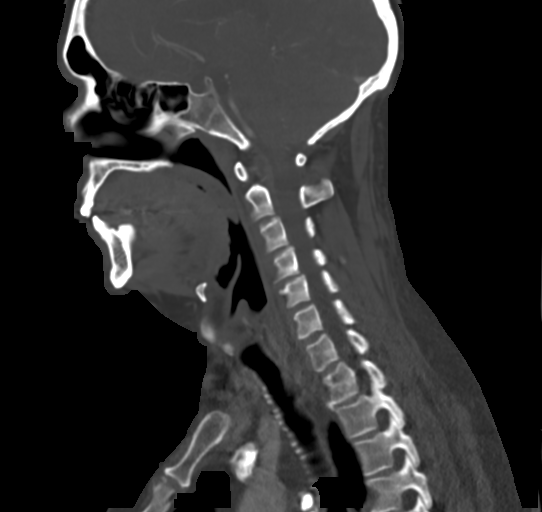
[im 60/90  bone]
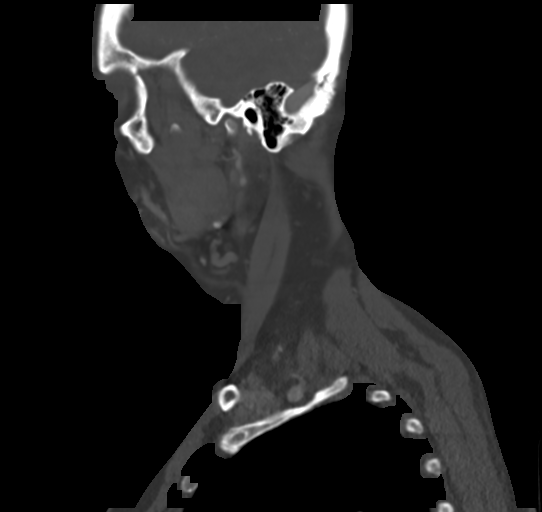
[im 75/90  bone]
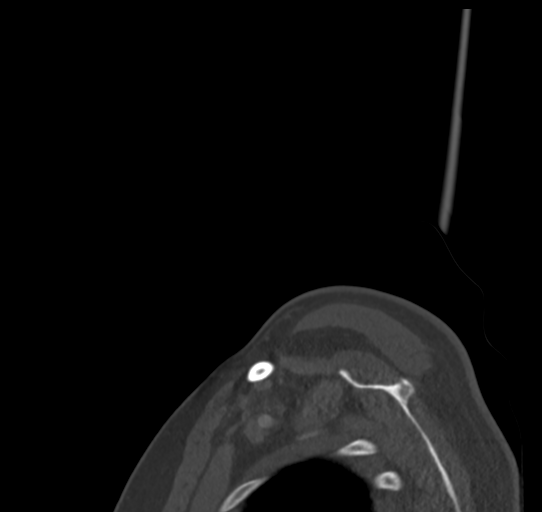

[14 of 34 positions shown; findings below may reference images not displayed]

DIAGNOSTIC STUDIES

EXAM

CT scan soft tissues of the neck with without contrast.

INDICATION

cephagia with chills, fever and sweats.
PT C/O CEPHALGIA, LOW GRADE FEVER, CHILLS. HX OF HERNIATED DISCS IN NECK. PAIN. GFR 115. QLLOX
GDBF022. CT/NM 1/0. TJ/KF

TECHNIQUE

All CT scans at this facility use dose modulation, iterative reconstruction, and/or weight based
dosing when appropriate to reduce radiation dose to as low as reasonably achievable.

Number of previous computed tomography exams in the last 12 months is 1.

Number of previous nuclear medicine myocardial perfusion studies in the last 12 months is 0  .

COMPARISONS

None available

FINDINGS

Visualized lung apices are within normal limits. Right upper lobe granuloma is noted. Visualized
intracranial structures are within normal limits. Scattered small reactive appearing cervical lymph
nodes are noted no abnormal fluid collections or soft tissue masses are seen

IMPRESSION

No abnormal mass or fluid collections are seen. A few small reactive cervical lymph nodes are seen.
Report called to Dr. Giorgi at [DATE] p.m.

Tech Notes:

PT C/O CEPHALGIA, LOW GRADE FEVER, CHILLS. HX OF HERNIATED DISCS IN NECK. PAIN. GFR 115. QLLOX
GDBF022. CT/NM 1/0. TJ/KF

## 2022-06-05 ENCOUNTER — Encounter: Admit: 2022-06-05 | Discharge: 2022-06-05

## 2022-06-05 ENCOUNTER — Ambulatory Visit: Admit: 2022-06-05 | Discharge: 2022-06-05

## 2022-06-05 DIAGNOSIS — H3530 Unspecified macular degeneration: Secondary | ICD-10-CM

## 2022-06-05 DIAGNOSIS — M2550 Pain in unspecified joint: Secondary | ICD-10-CM

## 2022-06-05 DIAGNOSIS — R112 Nausea with vomiting, unspecified: Secondary | ICD-10-CM

## 2022-06-05 DIAGNOSIS — K929 Disease of digestive system, unspecified: Secondary | ICD-10-CM

## 2022-06-05 DIAGNOSIS — N390 Urinary tract infection, site not specified: Secondary | ICD-10-CM

## 2022-06-05 DIAGNOSIS — E079 Disorder of thyroid, unspecified: Secondary | ICD-10-CM

## 2022-06-05 DIAGNOSIS — H5440 Blindness, one eye, unspecified eye: Secondary | ICD-10-CM

## 2022-06-05 DIAGNOSIS — R519 Generalized headaches: Secondary | ICD-10-CM

## 2022-06-05 DIAGNOSIS — H269 Unspecified cataract: Secondary | ICD-10-CM

## 2022-06-05 DIAGNOSIS — M1990 Unspecified osteoarthritis, unspecified site: Secondary | ICD-10-CM

## 2022-06-05 MED ORDER — ROCURONIUM 10 MG/ML IV SOLN
INTRAVENOUS | 0 refills | Status: DC
Start: 2022-06-05 — End: 2022-06-05

## 2022-06-05 MED ORDER — ONDANSETRON HCL (PF) 4 MG/2 ML IJ SOLN
INTRAVENOUS | 0 refills | Status: DC
Start: 2022-06-05 — End: 2022-06-05

## 2022-06-05 MED ORDER — PROPOFOL INJ 10 MG/ML IV VIAL
INTRAVENOUS | 0 refills | Status: DC
Start: 2022-06-05 — End: 2022-06-05

## 2022-06-05 MED ORDER — SUGAMMADEX 100 MG/ML IV SOLN
INTRAVENOUS | 0 refills | Status: DC
Start: 2022-06-05 — End: 2022-06-05

## 2022-06-05 MED ORDER — DEXAMETHASONE SODIUM PHOSPHATE 4 MG/ML IJ SOLN
INTRAVENOUS | 0 refills | Status: DC
Start: 2022-06-05 — End: 2022-06-05

## 2022-06-05 MED ORDER — LIDOCAINE (PF) 200 MG/10 ML (2 %) IJ SYRG
INTRAVENOUS | 0 refills | Status: DC
Start: 2022-06-05 — End: 2022-06-05

## 2022-06-05 MED ORDER — ARTIFICIAL TEARS (PF) SINGLE DOSE DROPS GROUP
OPHTHALMIC | 0 refills | Status: DC
Start: 2022-06-05 — End: 2022-06-05

## 2022-06-05 MED ORDER — FENTANYL CITRATE (PF) 50 MCG/ML IJ SOLN
INTRAVENOUS | 0 refills | Status: DC
Start: 2022-06-05 — End: 2022-06-05

## 2022-06-05 MED ORDER — EPHEDRINE SULFATE 50 MG/ML IV SOLN
INTRAVENOUS | 0 refills | Status: DC
Start: 2022-06-05 — End: 2022-06-05

## 2022-06-05 MED ORDER — MIDAZOLAM 1 MG/ML IJ SOLN
INTRAVENOUS | 0 refills | Status: DC
Start: 2022-06-05 — End: 2022-06-05

## 2022-06-05 MED FILL — ONDANSETRON HCL 4 MG PO TAB: 4 mg | ORAL | 2 days supply | Qty: 5 | Fill #1 | Status: CP

## 2022-06-05 MED FILL — OXYCODONE-ACETAMINOPHEN 5-325 MG PO TAB: 5-325 mg | ORAL | 2 days supply | Qty: 20 | Fill #1 | Status: CP

## 2022-06-05 NOTE — Anesthesia Post-Procedure Evaluation
Post-Anesthesia Evaluation    Name: Frank Jenkins      MRN: [id number]     DOB: March 01, 1956     Age: 66 y.o.     Sex: male   __________________________________________________________________________     Procedure Information     Anesthesia Start Date/Time: 06/05/22 0941    Procedure: LAPAROSCOPIC CHOLECYSTECTOMY (Abdomen)    [facility]    Surgeons: Carloyn Manner., MD          Post-Anesthesia Vitals  BP: 117/76 (08/04 1144)  Temp: 36.2 C (97.2 F) (08/04 1031)  Pulse: 73 (08/04 1144)  Respirations: 13 PER MINUTE (08/04 1144)  SpO2: 94 % (08/04 1144)  SpO2 Pulse: 73 (08/04 1144)  O2 Device: None (Room air) (08/04 1144)  Height: 175.3 cm (5\' 9" ) (08/04 0912)   Vitals Value Taken Time   BP 117/76 06/05/22 1144   Temp 36.2 C (97.2 F) 06/05/22 1031   Pulse 73 06/05/22 1144   Respirations 13 PER MINUTE 06/05/22 1144   SpO2 94 % 06/05/22 1144   O2 Device None (Room air) 06/05/22 1144   ABP     ART BP           Post Anesthesia Evaluation Note    Evaluation location: Pre/Post  Patient participation: recovered; patient participated in evaluation  Level of consciousness: alert    Pain score: 5  Pain management: adequate    Hydration: normovolemia  Temperature: 36.0C - 38.4C  Airway patency: adequate    Perioperative Events       Post-op nausea and vomiting: no PONV    Postoperative Status  Cardiovascular status: hemodynamically stable  Respiratory status: spontaneous ventilation  Follow-up needed: none        Perioperative Events  There were no known notable events for this encounter.

## 2022-06-05 NOTE — Anesthesia Pre-Procedure Evaluation
Anesthesia Pre-Procedure Evaluation    Name: Frank Jenkins      MRN: [id number]     DOB: 09/06/1956     Age: 66 y.o.     Sex: male   _________________________________________________________________________     Procedure Info:   Procedure Information     Date/Time: 06/05/22 1005    Procedure: LAPAROSCOPIC CHOLECYSTECTOMY (Abdomen)    Location: [facility] / [facility]    Surgeons: Carloyn Manner., MD          Physical Assessment  Vital Signs (last filed in past 24 hours):  BP: 148/86 (08/04 0912)  Temp: 36.4 ?C (97.5 ?F) (08/04 4540)  Pulse: 70 (08/04 0912)  Respirations: 15 PER MINUTE (08/04 0912)  SpO2: 99 % (08/04 0912)  O2 Device: None (Room air) (08/04 0912)  Height: 175.3 cm (5' 9) (08/04 9811)  Weight: 92.5 kg (204 lb) (08/04 0912)  SpO2 Pulse: 71 (08/04 0912)      Patient History   Allergies   Allergen Reactions   ? Albuterol RASH and EDEMA   ? Aspirin ANAPHYLAXIS and EDEMA   ? Ibuprofen ANAPHYLAXIS   ? Ketorolac ANAPHYLAXIS   ? Nsaids (Non-Steroidal Anti-Inflammatory Drug) ANAPHYLAXIS        Current Medications    Medication Directions   acetaminophen (TYLENOL) 500 mg tablet Take one tablet by mouth every 6 hours as needed for Pain. Max of 4,000 mg of acetaminophen in 24 hours.   cyclobenzaprine (FLEXERIL) 5 mg tablet Take one tablet to two tablets by mouth twice daily as needed for Muscle Cramps.   duloxetine DR (CYMBALTA) 60 mg capsule Take 1 capsule by mouth once daily   famotidine (PEPCID) 40 mg tablet Take one tablet by mouth at bedtime daily.   finasteride (PROSCAR) 5 mg tablet Take one tablet by mouth daily.   levothyroxine (SYNTHROID) 50 mcg tablet Take one tablet by mouth daily 30 minutes before breakfast.   methyl salicylate/menthol (BENGAY TP) Apply  topically to affected area daily as needed.   ondansetron HCL (ZOFRAN) 4 mg tablet Take one tablet by mouth every 8 hours as needed for Nausea or Vomiting. Indications: prevent nausea and vomiting after surgery   oxyCODONE-acetaminophen (PERCOCET) 5-325 mg tablet Take one tablet to two tablets by mouth every 4 hours as needed for Pain. Indications: pain   pantoprazole DR (PROTONIX) 40 mg tablet Take one tablet by mouth every morning. Indications: gastroesophageal reflux disease   sennosides-docusate sodium (SENNA WITH DOCUSATE SODIUM) 8.6/50 mg tablet Take one tablet by mouth twice daily.   sod picosulf-mag ox-citric ac (CLENPIQ) 10 mg-3.5 gram- 12 gram/160 mL oral solution Split dose by mouth per MyChart portal prep instructions   tiZANidine (ZANAFLEX) 2 mg tablet Take one tablet by mouth daily.   vit C,E-Zn-coppr-lutein-zeaxan (PRESERVISION AREDS-2) 250-90-40-1 mg cap          Review of Systems/Medical History      Patient summary reviewed  Nursing notes reviewed  Pertinent labs reviewed    PONV Screening: Non-smoker and Hx PONV/motion sickness  No history of anesthetic complications  No family history of anesthetic complications      Airway - negative        Pulmonary - negative          Cardiovascular - negative        Exercise tolerance: >4 METS      Beta Blocker therapy: No  GI/Hepatic/Renal       Inflammatory bowel disease        GERD,           Neuro/Psych         Headaches      Musculoskeletal         Back pain      Arthritis:         Endocrine/Other           Hypothyroidism       Physical Exam    Airway Findings      Mallampati: II      TM distance: >3 FB      Neck ROM: full      Mouth opening: good      Airway patency: adequate    Dental Findings: Negative      Cardiovascular Findings: Negative      Rhythm: regular      Rate: normal    Pulmonary Findings: Negative      Breath sounds clear to auscultation.    Abdominal Findings:       Not obese    Neurological Findings:       Alert and oriented x 3    Normal mental status    Constitutional findings:       No acute distress      Well-developed      Well-nourished       Diagnostic Tests  Hematology:   Lab Results   Component Value Date    HGB 12.9 02/28/2022    HCT 39.2 02/28/2022    PLTCT 267 02/28/2022    WBC 7.2 02/28/2022    NEUT 73 02/28/2022    ANC 5.22 02/28/2022    ALC 1.33 02/28/2022    MONA 8 02/28/2022    AMC 0.59 02/28/2022    EOSA 1 02/28/2022    ABC 0.03 02/28/2022    MCV 91.6 02/28/2022    MCH 30.2 02/28/2022    MCHC 33.0 02/28/2022    MPV 7.5 02/28/2022    RDW 14.6 02/28/2022         General Chemistry:   Lab Results   Component Value Date    NA 142 02/28/2022    K 3.7 02/28/2022    CL 106 02/28/2022    CO2 26 02/28/2022    GAP 10 02/28/2022    BUN 17 02/28/2022    CR 1.08 02/28/2022    GLU 86 02/28/2022    CA 8.9 02/28/2022    ALBUMIN 3.9 02/28/2022    TOTBILI 0.5 02/28/2022      Coagulation: No results found for: PT, PTT, INR      Anesthesia Plan    ASA score: 2   Plan: general  Induction method: intravenous  NPO status: acceptable      Informed Consent  Anesthetic plan and risks discussed with patient.        Plan discussed with: anesthesiologist and CRNA.    PAC Plan      Alerts

## 2022-06-08 ENCOUNTER — Encounter: Admit: 2022-06-08 | Discharge: 2022-06-08

## 2022-06-08 DIAGNOSIS — N390 Urinary tract infection, site not specified: Secondary | ICD-10-CM

## 2022-06-08 DIAGNOSIS — K929 Disease of digestive system, unspecified: Secondary | ICD-10-CM

## 2022-06-08 DIAGNOSIS — H3530 Unspecified macular degeneration: Secondary | ICD-10-CM

## 2022-06-08 DIAGNOSIS — R519 Generalized headaches: Secondary | ICD-10-CM

## 2022-06-08 DIAGNOSIS — R112 Nausea with vomiting, unspecified: Secondary | ICD-10-CM

## 2022-06-08 DIAGNOSIS — H269 Unspecified cataract: Secondary | ICD-10-CM

## 2022-06-08 DIAGNOSIS — E079 Disorder of thyroid, unspecified: Secondary | ICD-10-CM

## 2022-06-08 DIAGNOSIS — M1990 Unspecified osteoarthritis, unspecified site: Secondary | ICD-10-CM

## 2022-06-08 DIAGNOSIS — M2550 Pain in unspecified joint: Secondary | ICD-10-CM

## 2022-06-08 DIAGNOSIS — H5440 Blindness, one eye, unspecified eye: Secondary | ICD-10-CM

## 2022-06-11 ENCOUNTER — Encounter: Admit: 2022-06-11 | Discharge: 2022-06-11

## 2022-06-11 NOTE — Telephone Encounter
Verified 2 patient identifiers    Patient's spouse, Bonita Quin, called into the clinic requesting an appointment with Dr. Lorel Monaco due to patient having some recent issues of his back locking up after he had some ESI's with Dr. Lourdes Sledge. This RN called Bonita Quin back and spoke to her and requested that we get a new referral since patient hasn't been seen in our office since 02/2019 and we would need updated images of his back such as a MRI. This RN suggested Bonita Quin speak to Dr. Joan Flores office or patient's pcp for this referral. Patient's spouse, Bonita Quin,  was agreeable to this plan of care and had no further questions or concerns at this time.

## 2022-06-12 ENCOUNTER — Encounter: Admit: 2022-06-12 | Discharge: 2022-06-12

## 2022-06-12 NOTE — Telephone Encounter
Pt reports pain to low back and hips. States he called Dr. Esau Grew office and they requested referral from Dr. Lourdes Sledge and new L-spine MRI.     Pt currently has FUV with DR. Latif on 07/24/22 which is earliest available. Declined appt with APP.    Route to Dr. Lourdes Sledge.

## 2022-06-16 ENCOUNTER — Encounter: Admit: 2022-06-16 | Discharge: 2022-06-16

## 2022-06-18 ENCOUNTER — Ambulatory Visit: Admit: 2022-06-18 | Discharge: 2022-06-19 | Payer: BC Managed Care – PPO

## 2022-06-18 ENCOUNTER — Encounter: Admit: 2022-06-18 | Discharge: 2022-06-18

## 2022-06-18 DIAGNOSIS — R519 Generalized headaches: Secondary | ICD-10-CM

## 2022-06-18 DIAGNOSIS — M2550 Pain in unspecified joint: Secondary | ICD-10-CM

## 2022-06-18 DIAGNOSIS — Z9889 Other specified postprocedural states: Secondary | ICD-10-CM

## 2022-06-18 DIAGNOSIS — H269 Unspecified cataract: Secondary | ICD-10-CM

## 2022-06-18 DIAGNOSIS — H5440 Blindness, one eye, unspecified eye: Secondary | ICD-10-CM

## 2022-06-18 DIAGNOSIS — H3530 Unspecified macular degeneration: Secondary | ICD-10-CM

## 2022-06-18 DIAGNOSIS — K929 Disease of digestive system, unspecified: Secondary | ICD-10-CM

## 2022-06-18 DIAGNOSIS — R112 Nausea with vomiting, unspecified: Secondary | ICD-10-CM

## 2022-06-18 DIAGNOSIS — N390 Urinary tract infection, site not specified: Secondary | ICD-10-CM

## 2022-06-18 DIAGNOSIS — M1990 Unspecified osteoarthritis, unspecified site: Secondary | ICD-10-CM

## 2022-06-18 DIAGNOSIS — E079 Disorder of thyroid, unspecified: Secondary | ICD-10-CM

## 2022-06-18 NOTE — Progress Notes
Subjective:      Frank Jenkins returns to clinic today for post op visit after undergoing a laparoscopic cholecystectomy.  Patient reports that he is doing well overall.  Tolerating diet.  Denies fever or chills.     Objective:     BP 124/76 (BP Source: Arm, Left Upper, Patient Position: Sitting)  - Pulse 82  - Temp 36.7 C (98 F) (Temporal)  - Resp 16  - Wt 94.1 kg (207 lb 6.4 oz)  - SpO2 98%  - BMI 30.63 kg/m     General:  alert   Incision:   healing well, no drainage, no erythema, no hernia, no seroma, no swelling, no dehiscence, incision well approximated           Assessment:     S/P lap chole    Plan:     1. Instructed on incision care  2. Discussed increasing activities  3. RTC prn  4. He was most kind to bring me a slice of my Aunt's pie.

## 2022-06-18 NOTE — Patient Instructions
Your care team:      Dr. William Freund, MD  Sherry Murillo, APRN-NP               Nurse Practitioner   Hannah, RN, BSN  Kelly, RN, BSN  Sophie, RN, BSN  Meri Pelot, RN, BSN              Clinical Nurse Coordinators (CNC)                    Mychart:   - We strongly recommend signing up for Mychart, our patient access portal, and sending us non-urgent questions/concerns through this portal. We will reply within one business day.   - This is also a great resource to view all of your labs and scan results.   - Please feel free to ask our scheduler upon checkout for help setting up your Mychart access.       **For patients needing to establish care with a primary care provider within the [facility] network, please call [facility] Scheduling: [phone]     Phone numbers:      Direct Scheduling Line; [phone]  Direct Nurse Line: [phone]  Fax Number: [phone]     Please call nurse line (listed above) if you have questions or concerns during business hours.   Messages left on nurses line are checked Monday through Friday between the hours of 8:00 AM and 4:00 PM. Messages left after 3:00 pm will be returned the following business day.     Evening (after 4:00 PM), weekends, and holiday on-call: [phone]   For urgent needs after hours, please ask for the On-Call General Surgeon to be paged.      Notes:   - Allow 7-10 business days for our office to complete any requested paperwork (FMLA, etc.)   - Allow two to three business days for all medication refills. Please call your pharmacy first to check for available refills.

## 2022-06-30 ENCOUNTER — Encounter: Admit: 2022-06-30 | Discharge: 2022-06-30

## 2022-06-30 NOTE — Telephone Encounter
Received VM from patient's wife stating that a PA is needed for patient's pantoprazole prescription. Returned wife's call to verify patient identifiers and medication. I called patient's pharmacy ([facility], [place] [address]) and the pharmacist said that a PA is not needed at this time and that patient just recently picked up the prescription. Pharmacist informed me that if a PA is needed they will send our office a notification.

## 2022-07-12 ENCOUNTER — Encounter: Admit: 2022-07-12 | Discharge: 2022-07-12

## 2022-07-20 ENCOUNTER — Encounter: Admit: 2022-07-20 | Discharge: 2022-07-20

## 2022-07-24 ENCOUNTER — Encounter: Admit: 2022-07-24 | Discharge: 2022-07-24

## 2022-07-24 ENCOUNTER — Ambulatory Visit: Admit: 2022-07-24 | Discharge: 2022-07-25 | Payer: BC Managed Care – PPO

## 2022-07-24 VITALS — BP 122/88 | HR 74 | Ht 69.0 in | Wt 203.0 lb

## 2022-07-24 DIAGNOSIS — E079 Disorder of thyroid, unspecified: Secondary | ICD-10-CM

## 2022-07-24 DIAGNOSIS — N390 Urinary tract infection, site not specified: Secondary | ICD-10-CM

## 2022-07-24 DIAGNOSIS — G894 Chronic pain syndrome: Secondary | ICD-10-CM

## 2022-07-24 DIAGNOSIS — H269 Unspecified cataract: Secondary | ICD-10-CM

## 2022-07-24 DIAGNOSIS — M1990 Unspecified osteoarthritis, unspecified site: Secondary | ICD-10-CM

## 2022-07-24 DIAGNOSIS — R112 Nausea with vomiting, unspecified: Secondary | ICD-10-CM

## 2022-07-24 DIAGNOSIS — R519 Generalized headaches: Secondary | ICD-10-CM

## 2022-07-24 DIAGNOSIS — H5440 Blindness, one eye, unspecified eye: Secondary | ICD-10-CM

## 2022-07-24 DIAGNOSIS — H3530 Unspecified macular degeneration: Secondary | ICD-10-CM

## 2022-07-24 DIAGNOSIS — M2550 Pain in unspecified joint: Secondary | ICD-10-CM

## 2022-07-24 DIAGNOSIS — K929 Disease of digestive system, unspecified: Secondary | ICD-10-CM

## 2022-07-24 DIAGNOSIS — M62838 Other muscle spasm: Secondary | ICD-10-CM

## 2022-07-24 DIAGNOSIS — M47812 Spondylosis without myelopathy or radiculopathy, cervical region: Secondary | ICD-10-CM

## 2022-07-24 MED ORDER — METHOCARBAMOL 500 MG PO TAB
500 mg | ORAL_TABLET | Freq: Three times a day (TID) | ORAL | 3 refills | Status: AC | PRN
Start: 2022-07-24 — End: ?

## 2022-07-24 NOTE — Progress Notes
[facility]    Subjective     Chief Complaint: Pain    Interval HPI: Frank Jenkins is a 66 y.o. male who  has a past medical history of Arthritis, Blind painful right eye, Cataract, Disorder of thyroid gland, Gastrointestinal disorder, Generalized headaches, Joint pain (1995), Macular degeneration, PONV (postoperative nausea and vomiting), PONV (postoperative nausea and vomiting), and UTI (urinary tract infection). who now presents for evaluation.    He complains of pain in the neck.   Much worse in recent months.     It is axial only.     Feels very stiff and tight.     Extends into the upper back.     No UE radicular pain.    Axial pain only.     The pain has been present for about 28 years.     The pain is about 8-10/10.    Exacerbated with ROM.     Hx of spine surgery: L4-L5 Discectomy - Dr. Lorel Monaco    Patient denies fevers, chills, infection, bleeding issues, anticoagulants, new muscle weakness, numbness, tingling, bowel/bladder incontinence, or saddle anesthesia.           PRIOR MEDICATIONS:   Effective      Ineffective  Tylenol  Cymbalta  Flexeril    Unable to tolerate  NSAID - severe allergy  Tizanidine    Never  Gabapentin  Lyrica  Ami/Nortriptyline  Methocarbamol    PRIOR INTERVENTIONS:  L-spine surgery 03/2018 at [place]  Effective  left L4-S1 TFESI   TPI  LESI (prior to surgery)  CESI  Right L4-S1    Ineffective  Exercise, limited by pain  Recent CESI - minimal relief      Trintin Orleans denies any recent fevers, chills, infection, antibiotics, bowel or bladder incontinence, saddle anesthesia, bleeding issues, or recent anticoagulant.     Past Medical History:  Medical History:   Diagnosis Date   ? Arthritis    ? Blind painful right eye    ? Cataract    ? Disorder of thyroid gland     hypothyroidectomy   ? Gastrointestinal disorder     GERD   ? Generalized headaches    ? Joint pain 1995    Neck, back, knees, shoulder   ? Macular degeneration     left eye ? PONV (postoperative nausea and vomiting)    ? PONV (postoperative nausea and vomiting)    ? UTI (urinary tract infection)     hospitalized in february       Family History:  Family History   Problem Relation Age of Onset   ? Cataract Mother    ? Hypertension Mother    ? Hyperlipidemia Mother    ? Thyroid Disease Mother    ? Cancer Father         Lung cancer   ? Cancer Sister         Breast cancer   ? Cancer Brother         Colon cancer   ? Cancer Sister         Breast cancer   ? Cancer Sister         Surviving breast cancer   ? 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   Social History     Socioeconomic History   ?  Marital status: Married   Tobacco Use   ? Smoking status: Former     Packs/day: 0.25     Years: 4.00     Additional pack years: 0.00     Total pack years: 1.00     Types: Cigarettes     Quit date: 01/15/1979     Years since quitting: 43.5   ? Smokeless tobacco: Never   Substance and Sexual Activity   ? Alcohol use: Yes     Alcohol/week: 1.0 - 2.0 standard drink of alcohol     Types: 1 - 2 Cans of beer per week     Comment: Socially   ? Drug use: No   ? Sexual activity: Yes     Partners: Female     Birth control/protection: Post-menopausal       Allergies:  Allergies   Allergen Reactions   ? Albuterol RASH and EDEMA   ? Aspirin ANAPHYLAXIS and EDEMA   ? Ibuprofen ANAPHYLAXIS   ? Ketorolac ANAPHYLAXIS   ? Nsaids (Non-Steroidal Anti-Inflammatory Drug) ANAPHYLAXIS       Medications:    Current Outpatient Medications:   ?  acetaminophen (TYLENOL) 500 mg tablet, Take one tablet by mouth every 6 hours as needed for Pain. Max of 4,000 mg of acetaminophen in 24 hours., Disp: , Rfl:   ?  cyclobenzaprine (FLEXERIL) 5 mg tablet, Take one tablet to two tablets by mouth twice daily as needed for Muscle Cramps., Disp: 90 tablet, Rfl: 1  ?  duloxetine DR (CYMBALTA) 60 mg capsule, Take 1 capsule by mouth once daily, Disp: 30 capsule, Rfl: 5  ?  famotidine (PEPCID) 40 mg tablet, Take one tablet by mouth at bedtime daily., Disp: 90 tablet, Rfl: 3  ?  finasteride (PROSCAR) 5 mg tablet, Take one tablet by mouth daily., Disp: , Rfl:   ?  levothyroxine (SYNTHROID) 50 mcg tablet, Take one tablet by mouth daily 30 minutes before breakfast., Disp: , Rfl:   ?  methyl salicylate/menthol (BENGAY TP), Apply  topically to affected area daily as needed., Disp: , Rfl:   ?  ondansetron HCL (ZOFRAN) 4 mg tablet, Take one tablet by mouth every 8 hours as needed for Nausea or Vomiting. Indications: prevent nausea and vomiting after surgery, Disp: 5 tablet, Rfl: 0  ?  oxyCODONE-acetaminophen (PERCOCET) 5-325 mg tablet, Take one tablet to two tablets by mouth every 4 hours as needed for Pain. Indications: pain, Disp: 20 tablet, Rfl: 0  ?  pantoprazole DR (PROTONIX) 40 mg tablet, Take one tablet by mouth every morning. Indications: gastroesophageal reflux disease, Disp: 90 tablet, Rfl: 3  ?  sennosides-docusate sodium (SENNA WITH DOCUSATE SODIUM) 8.6/50 mg tablet, Take one tablet by mouth twice daily., Disp: 30 tablet, Rfl: 0  ?  sod picosulf-mag ox-citric ac (CLENPIQ) 10 mg-3.5 gram- 12 gram/160 mL oral solution, Split dose by mouth per MyChart portal prep instructions, Disp: 320 mL, Rfl: 0  ?  tiZANidine (ZANAFLEX) 2 mg tablet, Take one tablet by mouth daily., Disp: , Rfl:   ?  vit C,E-Zn-coppr-lutein-zeaxan (PRESERVISION AREDS-2) 250-90-40-1 mg cap, , Disp: , Rfl:     Current Facility-Administered Medications:   ?  fentaNYL citrate PF (SUBLIMAZE) injection 50-100 mcg, 50-100 mcg, Intravenous, Q2 MIN PRN, Evelina Bucy, MD, 50 mcg at 05/28/22 1453    Physical examination:   BP (P) 122/88  - Pulse (P) 74  - Ht 175.3 cm (5' 9)  - Wt 92.1 kg (203 lb)  - SpO2 (P) 98%  -  BMI 29.98 kg/m?   Pain Score: Eight      General: The patient is a well-developed, well nourished 66 year old male whom appears in no acute distress.   HEENT: Head is normocephalic and atraumatic.   Cardiac: Based on palpation, pulse appears to be regular rate and rhythm.   Pulmonary: The patient has unlabored respirations and bilateral symmetric chest excursion.   Abdomen: Soft, nontender. and nondistended.   Extremities: No ankle or wrist edema.      Neurologic:   The patient is alert and oriented times 3.        Musculoskeletal:     C-Spine   There is mod-severe cervical paraspinal tenderness. Paraspinal muscle tone is increased.  +facet loading strongly positive  ROM is limited due to pain with flexion, extension, rotation, and lateral bending.  Strength is equal and adequate bilaterally in the flexors and extensors of the bilateral upper extremities.   Mikey Bussing is negative bilaterally.          MRI C-Spine  FINDINGS:     Comparison is made with previous outside MRI of the cervical spine from   April 21, 2018.     The axial images are mildly limited by motion.     The cervical vertebral body heights and alignment are normal. There are no   aggressive or destructive geographic marrow lesions. There is no   paraspinal mass.     The visualized portions of the posterior fossa are unremarkable. The   foramen magnum is widely patent. The cervical and visualized upper   thoracic cord is normal in signal and caliber.     The level by level description is as follows:     C2-3: No spinal or foraminal stenosis     C3-4: Minimal posterior disc osteophyte complex. Asymmetric left   uncovertebral joint enlargement.. Mild left foraminal stenosis. No central   stenosis or right foraminal stenosis.     C4-5: Minimal posterior disc osteophyte complex. Asymmetric left   uncovertebral joint enlargement. Mild left foraminal stenosis.     C5-6: Unremarkable     C6-C7: Unremarkable     C7-T1: Unremarkable     IMPRESSION     1. Slightly limited exam secondary to motion.   2. Mild left neural foraminal stenosis at C3-4 and C4-5 as discussed.   3. No significant central spinal stenosis. No abnormal cord signal       MRI L-Spine    FINDINGS:     Comparison is made with prior MRIs from December 17, 2017 (preoperative)   and Mar 16, 2019 (postoperative)     There are 5 nonrib-bearing lumbar-type vertebral bodies. There are   postsurgical changes of a left L4 hemilaminectomy. These findings are   discussed further below.     There are no aggressive or destructive osseous lesions. There are several   small osseous hemangiomas. There are degenerative marrow signal changes   along several endplates.     There is no paraspinal mass.     The conus extends to the L1 level and is unremarkable.     The level by level description is as follows:     T12-L1: Unremarkable     L1-L2: Mild disc bulge. No spinal or foraminal stenosis.     L2-L3: Mild disc bulge. No spinal or foraminal stenosis.     L3-L4: Mild disc bulge asymmetrically greatest at the right foraminal/far   lateral location. Minimal lateral right foraminal stenosis (sagittal image  13 of series 3). No central spinal stenosis or left foraminal stenosis.     L4-L5: Prior left hemilaminectomy. Mild symmetric disc bulge. No   significant central stenosis. Minimal caudal bilateral foraminal stenosis.     L5-S1: Loss of disc height. There is both small central protrusion and   generalized disc bulge. The generalized bulges greater on the far lateral   location at the left. There is mild to moderate caudal far lateral left   foraminal stenosis.     IMPRESSION     1. Previous L4 left hemilaminectomy. There is a stable mild symmetric disc   bulge at L4-L5 without significant central stenosis. Minimal stable caudal   bilateral foraminal stenosis at this level.   2. L5-S1 central disc protrusion as well as asymmetric disc bulge greatest   at the far lateral location on the left. There is mild to moderate caudal   far lateral left foraminal stenosis.   3. Minimal lateral right foraminal stenosis at L3-4 secondary to   asymmetric L3-4 disc bulge.     Last Cr and LFT's:  Creatinine   Date Value Ref Range Status   02/28/2022 1.08 0.4 - 1.24 MG/DL Final     AST (SGOT)   Date Value Ref Range Status   02/28/2022 14 7 - 40 U/L Final     ALT (SGPT)   Date Value Ref Range Status   02/28/2022 20 7 - 56 U/L Final     Alk Phosphatase   Date Value Ref Range Status   02/28/2022 70 25 - 110 U/L Final     Total Bilirubin   Date Value Ref Range Status   02/28/2022 0.5 0.3 - 1.2 MG/DL Final          Assessment:    Frank Jenkins is a 66 y.o. male who  has a past medical history of Arthritis, Blind painful right eye, Cataract, Disorder of thyroid gland, Gastrointestinal disorder, Generalized headaches, Joint pain (1995), Macular degeneration, PONV (postoperative nausea and vomiting), PONV (postoperative nausea and vomiting), and UTI (urinary tract infection). who presents for evaluation of pain.    The pain complaints are most likely due to:    1. Mononeuropathy        2. Cervicalgia        3. Spondylosis of cervical region without myelopathy or radiculopathy        4. DDD (degenerative disc disease), cervical        5. Facet arthropathy        6. Muscle spasm        7. Neuropathic pain        8. Chronic pain syndrome            Patient has had an adequate trial of > 12 months of rest, exercise, multimodal treatment, and the passage of time without improvement of symptoms. The pain has significant impact on the daily quality of life.     Plan:    1. He had good relief with MBB, but not with RFA. He has had pain for >25 years. Failed conservative treatment.   2. Plan for PNS trial at the bilateral C6 medial branches (SPRINT). Risks/benefits discussed.   3. Will trial Robaxin 500mg  tid prn. Patient states he has not tried it.   4. Encouraged to continue at home PT program.  5. Follow up as needed.     Risks/benefits of all pharmacologic and interventional treatments discussed and questions answered.

## 2022-07-25 DIAGNOSIS — M47819 Spondylosis without myelopathy or radiculopathy, site unspecified: Secondary | ICD-10-CM

## 2022-07-25 DIAGNOSIS — M792 Neuralgia and neuritis, unspecified: Secondary | ICD-10-CM

## 2022-07-25 DIAGNOSIS — G589 Mononeuropathy, unspecified: Principal | ICD-10-CM

## 2022-07-25 DIAGNOSIS — M542 Cervicalgia: Secondary | ICD-10-CM

## 2022-07-25 DIAGNOSIS — M5030 Other cervical disc degeneration, unspecified cervical region: Secondary | ICD-10-CM

## 2022-07-27 ENCOUNTER — Encounter: Admit: 2022-07-27 | Discharge: 2022-07-27

## 2022-08-10 ENCOUNTER — Encounter: Admit: 2022-08-10 | Discharge: 2022-08-10

## 2022-08-10 NOTE — Telephone Encounter
08/10/2022 9:48 AM  Received prior authorization request for pantoprazole 40 mg DR. PA completed through CoverMyMeds. After submitting, notified that it is too soon to file PA. Form: Charity fundraiser PA Form [phone] NCPDP). Key: [id number]

## 2022-11-05 ENCOUNTER — Encounter: Admit: 2022-11-05 | Discharge: 2022-11-05

## 2022-11-05 NOTE — Telephone Encounter
11/05/2022 2:08 PM Received PA request for pantoprazole 40 mg once daily. PA completed and submitted through Cover my meds.

## 2023-02-11 ENCOUNTER — Encounter: Admit: 2023-02-11 | Discharge: 2023-02-11

## 2023-02-11 ENCOUNTER — Ambulatory Visit: Admit: 2023-02-11 | Discharge: 2023-02-11 | Payer: BC Managed Care – PPO

## 2023-02-11 DIAGNOSIS — K648 Other hemorrhoids: Secondary | ICD-10-CM

## 2023-02-11 DIAGNOSIS — M2550 Pain in unspecified joint: Secondary | ICD-10-CM

## 2023-02-11 DIAGNOSIS — H5440 Blindness, one eye, unspecified eye: Secondary | ICD-10-CM

## 2023-02-11 DIAGNOSIS — K929 Disease of digestive system, unspecified: Secondary | ICD-10-CM

## 2023-02-11 DIAGNOSIS — E079 Disorder of thyroid, unspecified: Secondary | ICD-10-CM

## 2023-02-11 DIAGNOSIS — M1990 Unspecified osteoarthritis, unspecified site: Secondary | ICD-10-CM

## 2023-02-11 DIAGNOSIS — K5000 Crohn's disease of small intestine without complications: Secondary | ICD-10-CM

## 2023-02-11 DIAGNOSIS — Z800 Family history of malignant neoplasm of digestive organs: Secondary | ICD-10-CM

## 2023-02-11 DIAGNOSIS — H3530 Unspecified macular degeneration: Secondary | ICD-10-CM

## 2023-02-11 DIAGNOSIS — N390 Urinary tract infection, site not specified: Secondary | ICD-10-CM

## 2023-02-11 DIAGNOSIS — R519 Generalized headaches: Secondary | ICD-10-CM

## 2023-02-11 DIAGNOSIS — K219 Gastro-esophageal reflux disease without esophagitis: Secondary | ICD-10-CM

## 2023-02-11 DIAGNOSIS — R112 Nausea with vomiting, unspecified: Secondary | ICD-10-CM

## 2023-02-11 DIAGNOSIS — H269 Unspecified cataract: Secondary | ICD-10-CM

## 2023-02-11 MED ORDER — FAMOTIDINE 40 MG PO TAB
40 mg | ORAL_TABLET | Freq: Every evening | ORAL | 3 refills | 90.00000 days | Status: AC
Start: 2023-02-11 — End: ?

## 2023-02-11 MED ORDER — PANTOPRAZOLE 40 MG PO TBEC
40 mg | ORAL_TABLET | Freq: Every morning | ORAL | 3 refills | 90.00000 days | Status: AC
Start: 2023-02-11 — End: ?

## 2023-02-11 MED ORDER — HYDROCORTISONE-PRAMOXINE 1-1 % RE CREA
1 g | Freq: Every evening | RECTAL | 3 refills | 50.00000 days | Status: AC
Start: 2023-02-11 — End: ?

## 2023-02-11 NOTE — Progress Notes
Date of Service: 02/11/2023    Subjective:             Frank Jenkins is a 67 y.o. male.    History of Present Illness    I saw Frank Jenkins in GI clinic.  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  His brother died of colon cancer at age 40.  Patient most recently underwent EGD and colonoscopy in May 2023.  Patient was also having spells of right upper quadrant pain.  Gallbladder ejection fraction was performed and showed no gallbladder emptying with reproduction of pain.  I referred the patient to general surgery and he underwent laparoscopic cholecystectomy last year.  Patient did well with this and is no longer having any right upper quadrant pain.  He takes pantoprazole 40 mg in the morning and famotidine 40 mg at bedtime and this generally controls his heartburn symptoms.  He occasionally gets some nighttime breakthrough reflux, perhaps 1-2 times per month.  He notes this especially if he eats late.  The patient is generally having about 3 bowel movements a day, but probably more more than his baseline since his gallbladder has been removed.  The patient had some recent labs performed at the [facility] and they were unremarkable.  The patient is complaining about some itching from his hemorrhoids.       Review of Systems   Constitutional: Negative.    HENT: Negative.     Eyes: Negative.    Respiratory: Negative.     Cardiovascular: Negative.    Gastrointestinal: Negative.    Endocrine: Negative.    Genitourinary: Negative.    Musculoskeletal: Negative.    Skin: Negative.    Allergic/Immunologic: Negative.    Neurological: Negative.    Hematological: Negative.    Psychiatric/Behavioral: Negative.     All other systems reviewed and are negative.    Objective:          acetaminophen (TYLENOL) 500 mg tablet Take one tablet by mouth every 6 hours as needed for Pain. Max of 4,000 mg of acetaminophen in 24 hours.    cyclobenzaprine (FLEXERIL) 5 mg tablet Take one tablet to two tablets by mouth twice daily as needed for Muscle Cramps.    duloxetine DR (CYMBALTA) 60 mg capsule Take 1 capsule by mouth once daily    famotidine (PEPCID) 40 mg tablet Take one tablet by mouth at bedtime daily.    finasteride (PROSCAR) 5 mg tablet Take one tablet by mouth daily.    levothyroxine (SYNTHROID) 50 mcg tablet Take one tablet by mouth daily 30 minutes before breakfast.    methocarbamoL (ROBAXIN) 500 mg tablet Take one tablet by mouth three times daily as needed for Spasms.    methyl salicylate/menthol (BENGAY TP) Apply  topically to affected area daily as needed.    ondansetron HCL (ZOFRAN) 4 mg tablet Take one tablet by mouth every 8 hours as needed for Nausea or Vomiting. Indications: prevent nausea and vomiting after surgery    oxyCODONE-acetaminophen (PERCOCET) 5-325 mg tablet Take one tablet to two tablets by mouth every 4 hours as needed for Pain. Indications: pain    pantoprazole DR (PROTONIX) 40 mg tablet Take one tablet by mouth every morning. Indications: gastroesophageal reflux disease    sennosides-docusate sodium (SENNA WITH DOCUSATE SODIUM) 8.6/50 mg tablet Take one tablet by mouth twice daily.    sod picosulf-mag ox-citric ac (CLENPIQ) 10 mg-3.5 gram- 12 gram/160 mL  oral solution Split dose by mouth per MyChart portal prep instructions    tiZANidine (ZANAFLEX) 2 mg tablet Take one tablet by mouth daily.    vit C,E-Zn-coppr-lutein-zeaxan (PRESERVISION AREDS-2) 250-90-40-1 mg cap      Vitals:    02/11/23 1107   Resp: 20   PainSc: Zero   Weight: 93.9 kg (207 lb)  Comment: Per pt   Height: 175.3 cm (5' 9)     Body mass index is 30.57 kg/m?Marland Kitchen     Physical Exam  Vital signs and nurses notes reviewed  Patient alert and oriented, appropriate  Lungs clear to auscultation  Heart regular rhythm and rate  Abdomen soft, non-tender, normal bowel sounds  Neuro exam without focal deficits       Assessment and Plan:             Frank Jenkins was seen today for follow up.    Diagnoses and all orders for this visit:    Gastroesophageal reflux disease without esophagitis  -     famotidine (PEPCID) 40 mg tablet; Take one tablet by mouth at bedtime daily.  -     pantoprazole DR (PROTONIX) 40 mg tablet; Take one tablet by mouth every morning. Indications: gastroesophageal reflux disease    Crohn's disease of small intestine without complication (HCC)    Other hemorrhoids  -     pramoxine-hydrocortisone (ANALPRAM-HC) 1-1 % rectal cream; Insert or Apply one g to rectal area as directed at bedtime daily.    Family history of colon cancer    Frank Jenkins has indolent terminal ileal Crohn's disease without symptoms.  He did have symptomatic cholelithiasis and now status post cholecystectomy.  He has nonerosive reflux.  I recommended he continue pantoprazole and famotidine at the current doses.  He might benefit from elevating the head of his bed.  I cautioned him not to eat late at night before bedtime.  I am going to give him some topical medicine to put on hemorrhoids at bedtime to treat itching.  The patient can take Gaviscon or Mylanta as needed for breakthrough heartburn.  I talked with him about increasing his doses of pantoprazole or famotidine, but he would prefer to stay on his current doses for now and try some of these other interventions.  The patient prefers to have surveillance EGD and colonoscopy 3 years after his last exam, so this will be due in 2026.  I will schedule the patient to return to GI clinic for routine follow-up in about 1 year.    Patient Instructions   Refill Pantoprazole and Famotidine once daily  Topical therapy for hemorrhoids at bedtime  Elevate head of bed   Gaviscon or Mylanta for breakthrough heartburn  EGD and colonoscopy in 2026  We would like to see you back in one year    If you have questions or concerns that need to be addressed before your next scheduled office visit, please call my nurse at [phone] or send me a MyChart patient message.    A total of 30 minutes were spent today on this encounter.  This includes preparing for the visit, reviewing medical records, obtaining relevant history, speaking to the patient, interpreting test results, ordering medications and tests and documenting the visit in the electronic health record.

## 2023-09-17 ENCOUNTER — Encounter: Admit: 2023-09-17 | Discharge: 2023-09-17

## 2023-11-08 ENCOUNTER — Encounter: Admit: 2023-11-08 | Discharge: 2023-11-08

## 2023-11-08 NOTE — Telephone Encounter
11/08/2023 10:32 AM   Prior authorization submitted for pantoprazole 40 mg once daily.        11/08/2023 10:33 AM   Received notification of prior authorization approval for pantoprazole, valid until 11/07/24.

## 2023-12-10 ENCOUNTER — Encounter: Admit: 2023-12-10 | Discharge: 2023-12-10

## 2024-02-15 ENCOUNTER — Encounter: Admit: 2024-02-15 | Discharge: 2024-02-15

## 2024-02-15 DIAGNOSIS — K219 Gastro-esophageal reflux disease without esophagitis: Secondary | ICD-10-CM

## 2024-02-15 MED ORDER — PANTOPRAZOLE 40 MG PO TBEC
40 mg | ORAL_TABLET | Freq: Every morning | ORAL | 0 refills | 90.00000 days | Status: AC
Start: 2024-02-15 — End: ?

## 2024-02-15 NOTE — Telephone Encounter
 Refill request received for:    pantoprazole  DR (PROTONIX ) 40 mg tablet     Sig: TAKE 1 TABLET BY MOUTH ONCE DAILY IN THE MORNING    Disp: 90 tablet    Refills: 0     Last OV: 02/11/23, upcoming OV on 02/17/24    Routing to Dr. Yasmin Helling for approval/refusal

## 2024-02-17 ENCOUNTER — Ambulatory Visit: Admit: 2024-02-17 | Discharge: 2024-02-18 | Payer: BLUE CROSS/BLUE SHIELD

## 2024-02-17 ENCOUNTER — Encounter: Admit: 2024-02-17 | Discharge: 2024-02-17

## 2024-02-17 DIAGNOSIS — Z800 Family history of malignant neoplasm of digestive organs: Secondary | ICD-10-CM

## 2024-02-17 DIAGNOSIS — K648 Other hemorrhoids: Secondary | ICD-10-CM

## 2024-02-17 DIAGNOSIS — K828 Other specified diseases of gallbladder: Secondary | ICD-10-CM

## 2024-02-17 DIAGNOSIS — K219 Gastro-esophageal reflux disease without esophagitis: Secondary | ICD-10-CM

## 2024-02-17 DIAGNOSIS — K5000 Crohn's disease of small intestine without complications: Secondary | ICD-10-CM

## 2024-02-17 MED ORDER — PANTOPRAZOLE 40 MG PO TBEC
40 mg | ORAL_TABLET | Freq: Every morning | ORAL | 3 refills | 90.00000 days | Status: AC
Start: 2024-02-17 — End: ?

## 2024-02-17 MED ORDER — FAMOTIDINE 40 MG PO TAB
40 mg | ORAL_TABLET | Freq: Every evening | ORAL | 3 refills | 90.00000 days | Status: AC
Start: 2024-02-17 — End: ?

## 2024-02-17 NOTE — Progress Notes
 Date of Service: 02/17/2024    Subjective:             Estes Lehner is a 68 y.o. male.    History of Present Illness    I had a GI clinic follow-up appointment today with Anastasio Balsam.  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  His brother died of colon cancer at age 46.  The patient's most recent EGD and colonoscopy were in May 2023.  Those exams showed a 2 cm hiatal hernia and some fundic gland polyps and a few terminal ileal erosions and some diverticulosis and hemorrhoids but no other lesions.  The patient prefers to repeat these in about 3 years, so will be due in 2026.  The patient had a laparoscopic cholecystectomy in 2023 due to biliary dyskinesia.  Regarding his reflux, he takes pantoprazole  40 mg in the morning and famotidine  40 mg at bedtime.  He has some breakthrough reflux symptoms 1 or 2 times per month, but usually this is if he eats late.  He elevates his head with two pillows at nighttime.  The patient denies any dysphagia.  Patient had recent blood work obtained from his primary care physician and states everything was normal.    Objective:         acetaminophen  (TYLENOL ) 500 mg tablet Take one tablet by mouth every 6 hours as needed for Pain. Max of 4,000 mg of acetaminophen  in 24 hours.    CHOLEcalciferoL (vitamin D3) 1,000 units tablet Take one tablet by mouth daily.    cyclobenzaprine  (FLEXERIL ) 5 mg tablet Take one tablet to two tablets by mouth twice daily as needed for Muscle Cramps. (Patient not taking: Reported on 02/17/2024)    duloxetine  DR (CYMBALTA ) 60 mg capsule Take 1 capsule by mouth once daily (Patient not taking: Reported on 02/17/2024)    famotidine  (PEPCID ) 40 mg tablet Take one tablet by mouth at bedtime daily.    finasteride (PROSCAR) 5 mg tablet Take one tablet by mouth daily.    levothyroxine (SYNTHROID) 50 mcg tablet Take one tablet by mouth daily 30 minutes before breakfast.    methocarbamoL  (ROBAXIN ) 500 mg tablet Take one tablet by mouth three times daily as needed for Spasms. (Patient not taking: Reported on 02/17/2024)    methyl salicylate/menthol (BENGAY TP) Apply  topically to affected area daily as needed. (Patient not taking: Reported on 02/17/2024)    ondansetron  HCL (ZOFRAN ) 4 mg tablet Take one tablet by mouth every 8 hours as needed for Nausea or Vomiting. Indications: prevent nausea and vomiting after surgery    oxyCODONE -acetaminophen  (PERCOCET) 5-325 mg tablet Take one tablet to two tablets by mouth every 4 hours as needed for Pain. Indications: pain    pantoprazole  DR (PROTONIX ) 40 mg tablet Take one tablet by mouth every morning.    pramoxine-hydrocortisone  (ANALPRAM -HC) 1-1 % rectal cream Insert or Apply one g to rectal area as directed at bedtime daily. (Patient not taking: Reported on 02/17/2024)    sennosides-docusate sodium (SENNA WITH DOCUSATE SODIUM) 8.6/50 mg tablet Take one tablet by mouth twice daily.    sod picosulf-mag ox-citric ac (CLENPIQ ) 10 mg-3.5 gram- 12 gram/160 mL oral solution Split dose by mouth per MyChart portal prep instructions    tiZANidine  (ZANAFLEX ) 2 mg tablet Take one tablet by mouth daily.    vit C,E-Zn-coppr-lutein-zeaxan (PRESERVISION AREDS-2) 250-90-40-1 mg cap  (Patient not taking: Reported on 02/17/2024)  Vitals:    02/17/24 0858   BP: 129/72   BP Source: Arm, Left Lower   Pulse: 67   PainSc: Zero   Weight: 93.7 kg (206 lb 9.6 oz)   Height: 175.3 cm (5' 9)     Body mass index is 30.51 kg/m?Aaron Aas     Physical Exam  Vital signs and nurses notes reviewed  Patient alert and oriented, appropriate  Lungs clear to auscultation  Heart regular rhythm and rate  Abdomen soft, non-tender, normal bowel sounds  Neuro exam without focal deficits       Assessment and Plan:               Callahan K. Yoshua Geisinger was seen today for follow up.    Diagnoses and all orders for this visit:    Gastroesophageal reflux disease without esophagitis  -     famotidine  (PEPCID ) 40 mg tablet; Take one tablet by mouth at bedtime daily.  -     pantoprazole  DR (PROTONIX ) 40 mg tablet; Take one tablet by mouth every morning.    Crohn's disease of small intestine without complication (CMS-HCC)    Other hemorrhoids    Biliary dyskinesia    Family history of colon cancer    Mr. Cogburn is fairly stable right now.  He has occasional breakthrough reflux symptoms at night, but he does not feel like these are severe enough to warrant increasing his antireflux medication doses.  I did refill pantoprazole  famotidine  for 1 year.  The patient will be due for EGD and colonoscopy next year.  He will call to schedule these at his convenience.  He gets labs annually through his primary care physician and these have been unremarkable.  I will schedule the patient for follow-up depending on the findings on EGD and colonoscopy next year.    Patient Instructions   Refill Pantoprazole  and Famotidine   Plan for EGD and colonoscopy Spring 2026 - ([facility], low volume prep) call to schedule    If you have questions or concerns that need to be addressed before your next scheduled office visit, please call my nurse at [phone] or send me a MyChart patient message.    A total of 30 minutes were spent today on this encounter.  This includes preparing for the visit, reviewing medical records, obtaining relevant history, speaking to the patient, interpreting test results, ordering medications and tests and documenting the visit in the electronic health record.

## 2024-05-04 ENCOUNTER — Encounter: Admit: 2024-05-04 | Discharge: 2024-05-04

## 2024-06-29 ENCOUNTER — Encounter: Admit: 2024-06-29 | Discharge: 2024-06-29

## 2024-07-04 ENCOUNTER — Encounter: Admit: 2024-07-04 | Discharge: 2024-07-04

## 2024-08-08 ENCOUNTER — Encounter: Admit: 2024-08-08 | Discharge: 2024-08-08

## 2024-08-14 ENCOUNTER — Encounter: Admit: 2024-08-14 | Discharge: 2024-08-14 | Payer: MEDICARE

## 2024-08-14 ENCOUNTER — Ambulatory Visit: Admit: 2024-08-14 | Discharge: 2024-08-15 | Payer: MEDICARE

## 2024-08-14 VITALS — BP 128/73 | HR 67 | Ht 69.0 in | Wt 192.0 lb

## 2024-08-14 DIAGNOSIS — M62838 Other muscle spasm: Secondary | ICD-10-CM

## 2024-08-14 MED ORDER — TIZANIDINE 2 MG PO TAB
2 mg | ORAL_TABLET | Freq: Every evening | ORAL | 1 refills | 30.00000 days | Status: AC
Start: 2024-08-14 — End: ?

## 2024-08-14 NOTE — Progress Notes
 [facility] - Interventional Pain    Subjective     Chief Complaint: Pain  Chief Complaint   Patient presents with    Neck - Follow Up    Head - Dizziness    Lower Back - Pain    Right Leg - Pain    Left Leg - Pain     Interval HPI: Frank Jenkins is a 68 y.o. male who  has a past medical history of Arthritis, Blind painful right eye, Cataract, Disorder of thyroid gland, Gastrointestinal disorder, Generalized headaches, Joint pain (1995), Macular degeneration, PONV (postoperative nausea and vomiting), PONV (postoperative nausea and vomiting), and UTI (urinary tract infection). who now presents for evaluation.    Returns after 2 years with complaints of neck pain and lower extremity pain.  He states that he recently had a steroid injection IM last week, which has made his leg pain significantly better since prior to.  He also has some cervical axial pain that is worse in the morning and causes him to feel very stiff and tight.  His radicular leg pain is located bilaterally and goes down the posterior thigh and into the lower leg.  He is currently taking only Tylenol .  He has done physical therapy in the past with little success.  Denies any bowel or bladder incontinence, saddle anesthesia.    LBP with BLE radicular pain in the posterolateral distribution.       Neck pian, axial. No UE radicular pain.       Hx of spine surgery: L4-L5 Discectomy - Dr. Burke    Patient denies fevers, chills, infection, bleeding issues, anticoagulants, new muscle weakness, numbness, tingling, bowel/bladder incontinence, or saddle anesthesia.       PRIOR MEDICATIONS:   Effective      Ineffective  Tylenol   Cymbalta   Flexeril     Unable to tolerate  NSAID - severe allergy  Tizanidine     Never  Gabapentin  Lyrica  Ami/Nortriptyline  Methocarbamol     PRIOR INTERVENTIONS:  L-spine surgery 03/2018 at [place]  Effective  left L4-S1 TFESI   TPI  LESI (prior to surgery)  CESI  Right L4-S1    Ineffective  Exercise, limited by pain  Recent CESI - minimal relief      Frank Jenkins denies any recent fevers, chills, infection, antibiotics, bowel or bladder incontinence, saddle anesthesia, bleeding issues, or recent anticoagulant.     Past Medical History:  Past Medical History:    Arthritis    Blind painful right eye    Cataract    Disorder of thyroid gland    Gastrointestinal disorder    Generalized headaches    Joint pain    Macular degeneration    PONV (postoperative nausea and vomiting)    PONV (postoperative nausea and vomiting)    UTI (urinary tract infection)       Family History:  Family History   Problem Relation Name Age of Onset    Cataract Mother Frank Jenkins     Hypertension Mother Frank Jenkins     Hyperlipidemia Mother Frank Jenkins     Thyroid Disease Mother Frank Jenkins     Cancer Father Frank Jenkins         Lung cancer    Cancer Sister Frank Jenkins         Breast cancer    Cancer Brother Frank Jenkins         Colon cancer    Cancer Sister Frank Jenkins         Breast cancer  Cancer Sister Frank Jenkins         Surviving breast cancer    Cancer Brother Frank Jenkins         Multiple Myeloma died 10/02/2020       Social History:  Lives in [place] [id number]  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   Social History     Socioeconomic History    Marital status: Married   Tobacco Use    Smoking status: Former     Current packs/day: 0.00     Average packs/day: 0.3 packs/day for 4.0 years (1.0 ttl pk-yrs)     Types: Cigarettes     Start date: 01/15/1975     Quit date: 01/15/1979     Years since quitting: 45.6    Smokeless tobacco: Never   Substance and Sexual Activity    Alcohol use: Yes     Alcohol/week: 1.0 - 2.0 standard drink of alcohol     Types: 1 - 2 Cans of beer per week     Comment: Socially    Drug use: No    Sexual activity: Yes     Partners: Female     Birth control/protection: Post-menopausal       Allergies:  Allergies   Allergen Reactions    Albuterol RASH and EDEMA    Aspirin ANAPHYLAXIS and EDEMA    Ibuprofen ANAPHYLAXIS    Ketorolac ANAPHYLAXIS    Nsaids (Non-Steroidal Anti-Inflammatory Drug) ANAPHYLAXIS       Medications:    Current Outpatient Medications:     acetaminophen  (TYLENOL ) 500 mg tablet, Take one tablet by mouth every 6 hours as needed for Pain. Max of 4,000 mg of acetaminophen  in 24 hours., Disp: , Rfl:     CHOLEcalciferoL (vitamin D3) 1,000 units tablet, Take one tablet by mouth daily., Disp: , Rfl:     cyclobenzaprine  (FLEXERIL ) 5 mg tablet, Take one tablet to two tablets by mouth twice daily as needed for Muscle Cramps. (Patient not taking: Reported on 02/17/2024), Disp: 90 tablet, Rfl: 1    duloxetine  DR (CYMBALTA ) 60 mg capsule, Take 1 capsule by mouth once daily, Disp: 30 capsule, Rfl: 5    famotidine  (PEPCID ) 40 mg tablet, Take one tablet by mouth at bedtime daily., Disp: 90 tablet, Rfl: 3    finasteride (PROSCAR) 5 mg tablet, Take one tablet by mouth daily., Disp: , Rfl:     levothyroxine (SYNTHROID) 50 mcg tablet, Take one tablet by mouth daily 30 minutes before breakfast., Disp: , Rfl:     methocarbamoL  (ROBAXIN ) 500 mg tablet, Take one tablet by mouth three times daily as needed for Spasms. (Patient not taking: Reported on 02/17/2024), Disp: 90 tablet, Rfl: 3    methyl salicylate/menthol (BENGAY TP), Apply  topically to affected area daily as needed., Disp: , Rfl:     ondansetron  HCL (ZOFRAN ) 4 mg tablet, Take one tablet by mouth every 8 hours as needed for Nausea or Vomiting. Indications: prevent nausea and vomiting after surgery, Disp: 5 tablet, Rfl: 0    oxyCODONE -acetaminophen  (PERCOCET) 5-325 mg tablet, Take one tablet to two tablets by mouth every 4 hours as needed for Pain. Indications: pain, Disp: 20 tablet, Rfl: 0    pantoprazole  DR (PROTONIX ) 40 mg tablet, Take one tablet by mouth every morning., Disp: 90 tablet, Rfl: 3    pramoxine-hydrocortisone  (ANALPRAM -HC) 1-1 % rectal cream, Insert or Apply one g to rectal area as directed at bedtime daily., Disp: 30 g, Rfl: 3    sennosides-docusate sodium (SENNA WITH  DOCUSATE SODIUM) 8.6/50 mg tablet, Take one tablet by mouth twice daily., Disp: 30 tablet, Rfl: 0    sod picosulf-mag ox-citric ac (CLENPIQ ) 10 mg-3.5 gram- 12 gram/160 mL oral solution, Split dose by mouth per MyChart portal prep instructions, Disp: 320 mL, Rfl: 0    tiZANidine  (ZANAFLEX ) 2 mg tablet, Take one tablet by mouth at bedtime daily., Disp: 30 tablet, Rfl: 1    vit C,E-Zn-coppr-lutein-zeaxan (PRESERVISION AREDS-2) 250-90-40-1 mg cap, , Disp: , Rfl:     Current Facility-Administered Medications:     fentaNYL  citrate PF (SUBLIMAZE ) injection 50-100 mcg, 50-100 mcg, Intravenous, Q2 MIN PRN, Lazarus Colace, MD, 50 mcg at 05/28/22 1453    Physical examination:   BP 128/73 (BP Source: Arm, Left Upper, Patient Position: Sitting)  - Pulse 67  - Ht 175.3 cm (5' 9)  - Wt 87.1 kg (192 lb)  - SpO2 98%  - BMI 28.35 kg/m?   Pain Score: Two      General: The patient is a well-developed, well nourished 68 year old male whom appears in no acute distress.   HEENT: Head is normocephalic and atraumatic.   Cardiac: Based on palpation, pulse appears to be regular rate and rhythm.   Pulmonary: The patient has unlabored respirations and bilateral symmetric chest excursion.   Abdomen: Soft, nontender. and nondistended.   Extremities: No ankle or wrist edema.      Neurologic:   The patient is alert and oriented times 3.        Musculoskeletal:     C-Spine   There is mod-severe cervical paraspinal tenderness. Paraspinal muscle tone is increased.  +facet loading strongly positive  ROM is limited due to pain with flexion, extension, rotation, and lateral bending.  Strength is equal and adequate bilaterally in the flexors and extensors of the bilateral upper extremities.   Rosan is negative bilaterally.      Lumbar spine:  Appearance: No lesions or deformity  Lumbar tenderness: Yes  SI joint tenderness: Negative bilaterally  Pain with extension: Yes  Pain with lateral flexion: Bilateral  Sensation to light touch - left lower extremity: intact  Sensation to light touch - right lower extremity: intact  Straight leg raise: Positive bilaterally  FABER test: Negative bilaterally  FADIR test: Negative bilaterally  Lateral pelvic compression: Deferred  Thigh thrust: Deferred  Gaenslen test: Deferred      MRI C-Spine  FINDINGS:     Comparison is made with previous outside MRI of the cervical spine from   April 21, 2018.     The axial images are mildly limited by motion.     The cervical vertebral body heights and alignment are normal. There are no   aggressive or destructive geographic marrow lesions. There is no   paraspinal mass.     The visualized portions of the posterior fossa are unremarkable. The   foramen magnum is widely patent. The cervical and visualized upper   thoracic cord is normal in signal and caliber.     The level by level description is as follows:     C2-3: No spinal or foraminal stenosis     C3-4: Minimal posterior disc osteophyte complex. Asymmetric left   uncovertebral joint enlargement.. Mild left foraminal stenosis. No central   stenosis or right foraminal stenosis.     C4-5: Minimal posterior disc osteophyte complex. Asymmetric left   uncovertebral joint enlargement. Mild left foraminal stenosis.     C5-6: Unremarkable     C6-C7: Unremarkable  C7-T1: Unremarkable     IMPRESSION     1. Slightly limited exam secondary to motion.   2. Mild left neural foraminal stenosis at C3-4 and C4-5 as discussed.   3. No significant central spinal stenosis. No abnormal cord signal       MRI L-Spine    FINDINGS:     Comparison is made with prior MRIs from December 17, 2017 (preoperative)   and Mar 16, 2019 (postoperative)     There are 5 nonrib-bearing lumbar-type vertebral bodies. There are   postsurgical changes of a left L4 hemilaminectomy. These findings are   discussed further below.     There are no aggressive or destructive osseous lesions. There are several   small osseous hemangiomas. There are degenerative marrow signal changes   along several endplates.     There is no paraspinal mass. The conus extends to the L1 level and is unremarkable.     The level by level description is as follows:     T12-L1: Unremarkable     L1-L2: Mild disc bulge. No spinal or foraminal stenosis.     L2-L3: Mild disc bulge. No spinal or foraminal stenosis.     L3-L4: Mild disc bulge asymmetrically greatest at the right foraminal/far   lateral location. Minimal lateral right foraminal stenosis (sagittal image   13 of series 3). No central spinal stenosis or left foraminal stenosis.     L4-L5: Prior left hemilaminectomy. Mild symmetric disc bulge. No   significant central stenosis. Minimal caudal bilateral foraminal stenosis.     L5-S1: Loss of disc height. There is both small central protrusion and   generalized disc bulge. The generalized bulges greater on the far lateral   location at the left. There is mild to moderate caudal far lateral left   foraminal stenosis.     IMPRESSION     1. Previous L4 left hemilaminectomy. There is a stable mild symmetric disc   bulge at L4-L5 without significant central stenosis. Minimal stable caudal   bilateral foraminal stenosis at this level.   2. L5-S1 central disc protrusion as well as asymmetric disc bulge greatest   at the far lateral location on the left. There is mild to moderate caudal   far lateral left foraminal stenosis.   3. Minimal lateral right foraminal stenosis at L3-4 secondary to   asymmetric L3-4 disc bulge.     Last Cr and LFT's:  Creatinine   Date Value Ref Range Status   02/28/2022 1.08 0.4 - 1.24 MG/DL Final     AST (SGOT)   Date Value Ref Range Status   02/28/2022 14 7 - 40 U/L Final     ALT (SGPT)   Date Value Ref Range Status   02/28/2022 20 7 - 56 U/L Final     Alk Phosphatase   Date Value Ref Range Status   02/28/2022 70 25 - 110 U/L Final     Total Bilirubin   Date Value Ref Range Status   02/28/2022 0.5 0.3 - 1.2 MG/DL Final          Assessment:    Carmon Sahli is a 68 y.o. male who  has a past medical history of Arthritis, Blind painful right eye, Cataract, Disorder of thyroid gland, Gastrointestinal disorder, Generalized headaches, Joint pain (1995), Macular degeneration, PONV (postoperative nausea and vomiting), PONV (postoperative nausea and vomiting), and UTI (urinary tract infection). who presents for evaluation of pain.    The pain complaints are most likely  due to:    1. Lumbar radiculopathy  [place] [facility] SPINE TRANSFORAMINAL LUMBAR/SACRAL THERAPEUTIC      2. Spondylosis of cervical region without myelopathy or radiculopathy  PERIPHERAL NERVE STIMULATOR TRIAL      3. Muscle spasm              Patient has had an adequate trial of > 12 months of rest, exercise, multimodal treatment, and the passage of time without improvement of symptoms. The pain has significant impact on the daily quality of life.     Plan:    1. Plan for PNS trial at the bilateral C6 medial branches (SPRINT). Risks/benefits discussed.  Patient with a previous cervical RFA with some lasting benefit done around 2 years ago consider repeating this if the PNS trial is unsuccessful    2.  Order bilateral L5-S1 TFESI; patient will call to schedule when his pain returns following his recent IM steroid injection    This patient's clinical history, exam, AND imaging support radiculopathy AND there is a significant impact on quality of life and function AND the pain has been present for at least 4 weeks AND they have failed to improve with noninvasive conservative care.     3. Will trial tizanidine  2mg  QHS prn for muscle spasm. Patient states he has tried it.   4. Encouraged to continue at home PT program.  5. Follow up as needed.     Risks/benefits of all pharmacologic and interventional treatments discussed and questions answered.        Jayson Leontine Dayhoff MD  Pain Fellow, PGY5  Attending: Lazarus MD         ATTESTATION    I personally performed the key portions of the E/M visit, discussed case with resident and concur with resident documentation of history, physical exam, assessment, and treatment plan unless otherwise noted.    Staff name:  Rupert Lazarus, MD Date: 08/14/2024

## 2024-08-15 DIAGNOSIS — M47812 Spondylosis without myelopathy or radiculopathy, cervical region: Secondary | ICD-10-CM

## 2024-08-15 DIAGNOSIS — M5416 Radiculopathy, lumbar region: Principal | ICD-10-CM

## 2024-09-21 ENCOUNTER — Encounter: Admit: 2024-09-21 | Discharge: 2024-09-21 | Payer: MEDICARE

## 2024-10-10 ENCOUNTER — Ambulatory Visit: Admit: 2024-10-10 | Discharge: 2024-10-10 | Payer: MEDICARE

## 2024-10-10 ENCOUNTER — Encounter: Admit: 2024-10-10 | Discharge: 2024-10-10 | Payer: MEDICARE

## 2024-10-12 ENCOUNTER — Encounter: Admit: 2024-10-12 | Discharge: 2024-10-12 | Payer: MEDICARE

## 2024-10-17 ENCOUNTER — Encounter: Admit: 2024-10-17 | Discharge: 2024-10-17 | Payer: MEDICARE

## 2024-10-17 ENCOUNTER — Ambulatory Visit: Admit: 2024-10-17 | Discharge: 2024-10-18 | Payer: MEDICARE

## 2024-10-17 VITALS — BP 145/81 | HR 77 | Ht 69.0 in | Wt 190.0 lb

## 2024-10-17 DIAGNOSIS — M792 Neuralgia and neuritis, unspecified: Secondary | ICD-10-CM

## 2024-10-17 DIAGNOSIS — M47812 Spondylosis without myelopathy or radiculopathy, cervical region: Secondary | ICD-10-CM

## 2024-10-17 DIAGNOSIS — G589 Mononeuropathy, unspecified: Principal | ICD-10-CM

## 2024-10-17 DIAGNOSIS — G894 Chronic pain syndrome: Secondary | ICD-10-CM

## 2024-10-17 NOTE — Progress Notes [1]
 [facility] CLIN[facility] NOTE  Subjective     SUBJECTIVE:  Following up after Stim trial   Manufacturer: Sprint  Duration of trial: 7 days  Location of pain: Neck pain  Improvement in pain: 0%  Improvement in function: No  Reduction of pain medication: N/A  Side effects/complications: none  Patient comments: See HPI    History of Present Illness  Frank Jenkins is a 68 year old male who presents for a one-week follow-up after a Sprint trial for neck pain.    He underwent a Sprint trial on October 10, 2024, for neck pain. Post-procedure, they experienced some redness and sweating around the dressing area, causing the dressing to peel off more easily.    The initial days following the procedure were challenging, but there has been a slight improvement in symptoms over the past week. Initially, they did not feel much relief at a setting of 35.    He experienced occasional pressure in their head when the settings were at 30 and are concerned that lowering the settings too much might reduce the treatment's effectiveness. Rep will change the setting to 25 and will monitor for improvements.        Current Medications[1]  Allergies[2]  Physical Exam  General: Alert, oriented, no acute distress  Resp: Non labored breathing  Skin: lead insertion site CDI, leads x 2 pulled without difficulty and contacts intact   Vitals:    10/17/24 1346   BP: (!) 145/81   Pulse: 77   SpO2: 99%   PainSc: Five   Weight: 86.2 kg (190 lb)   Height: 175.3 cm (5' 9)     Oswestry Total Score:: (Patient-Rptd) 38  Pain Score: Five  Body mass index is 28.06 kg/m?SABRA           IMPRESSION:  1. Mononeuropathy, unspecified    2. Chronic pain syndrome    3. Neuropathic pain    4. Spondylosis of cervical region without myelopathy or radiculopathy          PLAN:    Dressing changed, leads intact. Area cleansed and new dressing applied  Instructed to cover the site when showering for 60 days  Instructed to call if any sx of infection including fevers, swelling, drainage, increased pain, inflammation or warmth to site.  Follow-up in 7 weeks for PNS stim removal       [1]   Current Outpatient Medications:     acetaminophen  (TYLENOL ) 500 mg tablet, Take one tablet by mouth every 6 hours as needed for Pain. Max of 4,000 mg of acetaminophen  in 24 hours., Disp: , Rfl:     CHOLEcalciferoL (vitamin D3) 1,000 units tablet, Take one tablet by mouth daily., Disp: , Rfl:     cyclobenzaprine  (FLEXERIL ) 5 mg tablet, Take one tablet to two tablets by mouth twice daily as needed for Muscle Cramps. (Patient not taking: Reported on 02/17/2024), Disp: 90 tablet, Rfl: 1    duloxetine  DR (CYMBALTA ) 60 mg capsule, Take 1 capsule by mouth once daily, Disp: 30 capsule, Rfl: 5    famotidine  (PEPCID ) 40 mg tablet, Take one tablet by mouth at bedtime daily., Disp: 90 tablet, Rfl: 3    finasteride (PROSCAR) 5 mg tablet, Take one tablet by mouth daily., Disp: , Rfl:     levothyroxine (SYNTHROID) 50 mcg tablet, Take one tablet by mouth daily 30 minutes before breakfast., Disp: , Rfl:     methocarbamoL  (ROBAXIN ) 500 mg tablet, Take one tablet by mouth three times  daily as needed for Spasms. (Patient not taking: Reported on 02/17/2024), Disp: 90 tablet, Rfl: 3    methyl salicylate/menthol (BENGAY TP), Apply  topically to affected area daily as needed., Disp: , Rfl:     ondansetron  HCL (ZOFRAN ) 4 mg tablet, Take one tablet by mouth every 8 hours as needed for Nausea or Vomiting. Indications: prevent nausea and vomiting after surgery, Disp: 5 tablet, Rfl: 0    oxyCODONE -acetaminophen  (PERCOCET) 5-325 mg tablet, Take one tablet to two tablets by mouth every 4 hours as needed for Pain. Indications: pain, Disp: 20 tablet, Rfl: 0    pantoprazole  DR (PROTONIX ) 40 mg tablet, Take one tablet by mouth every morning., Disp: 90 tablet, Rfl: 3    pramoxine-hydrocortisone  (ANALPRAM -HC) 1-1 % rectal cream, Insert or Apply one g to rectal area as directed at bedtime daily., Disp: 30 g, Rfl: 3 sennosides-docusate sodium (SENNA WITH DOCUSATE SODIUM) 8.6/50 mg tablet, Take one tablet by mouth twice daily., Disp: 30 tablet, Rfl: 0    sod picosulf-mag ox-citric ac (CLENPIQ ) 10 mg-3.5 gram- 12 gram/160 mL oral solution, Split dose by mouth per MyChart portal prep instructions, Disp: 320 mL, Rfl: 0    tiZANidine  (ZANAFLEX ) 2 mg tablet, Take one tablet by mouth at bedtime daily., Disp: 30 tablet, Rfl: 1    vit C,E-Zn-coppr-lutein-zeaxan (PRESERVISION AREDS-2) 250-90-40-1 mg cap, , Disp: , Rfl:     Current Facility-Administered Medications:     fentaNYL  citrate PF (SUBLIMAZE ) injection 50-100 mcg, 50-100 mcg, Intravenous, Q2 MIN PRN, Lazarus, Usman, MD, 50 mcg at 05/28/22 1453  [2]   Allergies  Allergen Reactions    Albuterol RASH and EDEMA    Aspirin ANAPHYLAXIS and EDEMA    Ibuprofen ANAPHYLAXIS    Ketorolac ANAPHYLAXIS    Nsaids (Non-Steroidal Anti-Inflammatory Drug) ANAPHYLAXIS

## 2024-11-10 ENCOUNTER — Encounter: Admit: 2024-11-10 | Discharge: 2024-11-10 | Payer: MEDICARE

## 2024-11-13 ENCOUNTER — Encounter: Admit: 2024-11-13 | Discharge: 2024-11-13 | Payer: MEDICARE

## 2024-11-15 ENCOUNTER — Encounter: Admit: 2024-11-15 | Discharge: 2024-11-15 | Payer: MEDICARE

## 2024-12-07 ENCOUNTER — Encounter: Admit: 2024-12-07 | Discharge: 2024-12-07 | Payer: MEDICARE
